# Patient Record
Sex: Male | Born: 1970 | Race: White | Hispanic: No | Marital: Single | Smoking: Current some day smoker
Health system: Southern US, Community
[De-identification: ages and names within clinical notes are randomized; demographics above are authoritative.]

## PROBLEM LIST (undated history)

## (undated) DIAGNOSIS — K633 Ulcer of intestine: Secondary | ICD-10-CM

## (undated) DIAGNOSIS — E119 Type 2 diabetes mellitus without complications: Secondary | ICD-10-CM

## (undated) DIAGNOSIS — N200 Calculus of kidney: Secondary | ICD-10-CM

## (undated) DIAGNOSIS — E785 Hyperlipidemia, unspecified: Secondary | ICD-10-CM

## (undated) DIAGNOSIS — F32A Depression, unspecified: Secondary | ICD-10-CM

## (undated) DIAGNOSIS — F419 Anxiety disorder, unspecified: Secondary | ICD-10-CM

## (undated) DIAGNOSIS — I10 Essential (primary) hypertension: Secondary | ICD-10-CM

## (undated) DIAGNOSIS — B019 Varicella without complication: Secondary | ICD-10-CM

## (undated) DIAGNOSIS — M549 Dorsalgia, unspecified: Secondary | ICD-10-CM

## (undated) DIAGNOSIS — F329 Major depressive disorder, single episode, unspecified: Secondary | ICD-10-CM

## (undated) HISTORY — DX: Ulcer of intestine: K63.3

## (undated) HISTORY — PX: HERNIA REPAIR: SHX51

## (undated) HISTORY — DX: Anxiety disorder, unspecified: F41.9

## (undated) HISTORY — DX: Type 2 diabetes mellitus without complications: E11.9

## (undated) HISTORY — DX: Varicella without complication: B01.9

## (undated) HISTORY — DX: Essential (primary) hypertension: I10

## (undated) HISTORY — PX: FRACTURE SURGERY: SHX138

## (undated) HISTORY — PX: BACK SURGERY: SHX140

## (undated) HISTORY — DX: Hyperlipidemia, unspecified: E78.5

## (undated) HISTORY — PX: SPINE SURGERY: SHX786

## (undated) HISTORY — DX: Calculus of kidney: N20.0

---

## 2008-03-26 HISTORY — PX: ANKLE FRACTURE SURGERY: SHX122

## 2008-03-26 HISTORY — PX: ELBOW FRACTURE SURGERY: SHX616

## 2015-03-08 ENCOUNTER — Emergency Department (HOSPITAL_COMMUNITY): Payer: BC Managed Care – PPO

## 2015-03-08 ENCOUNTER — Inpatient Hospital Stay (HOSPITAL_COMMUNITY): Payer: BC Managed Care – PPO

## 2015-03-08 ENCOUNTER — Encounter (HOSPITAL_COMMUNITY): Payer: Self-pay | Admitting: Emergency Medicine

## 2015-03-08 ENCOUNTER — Inpatient Hospital Stay (HOSPITAL_COMMUNITY)
Admission: EM | Admit: 2015-03-08 | Discharge: 2015-03-10 | DRG: 918 | Disposition: A | Discharge status: Home / Self Care | Payer: BC Managed Care – PPO | Attending: Internal Medicine | Admitting: Internal Medicine

## 2015-03-08 DIAGNOSIS — M549 Dorsalgia, unspecified: Secondary | ICD-10-CM | POA: Diagnosis present

## 2015-03-08 DIAGNOSIS — R4182 Altered mental status, unspecified: Secondary | ICD-10-CM | POA: Diagnosis present

## 2015-03-08 DIAGNOSIS — I10 Essential (primary) hypertension: Secondary | ICD-10-CM | POA: Diagnosis present

## 2015-03-08 DIAGNOSIS — E872 Acidosis, unspecified: Secondary | ICD-10-CM

## 2015-03-08 DIAGNOSIS — G4730 Sleep apnea, unspecified: Secondary | ICD-10-CM | POA: Diagnosis present

## 2015-03-08 DIAGNOSIS — R410 Disorientation, unspecified: Secondary | ICD-10-CM

## 2015-03-08 DIAGNOSIS — T50904A Poisoning by unspecified drugs, medicaments and biological substances, undetermined, initial encounter: Secondary | ICD-10-CM | POA: Diagnosis not present

## 2015-03-08 DIAGNOSIS — T50901A Poisoning by unspecified drugs, medicaments and biological substances, accidental (unintentional), initial encounter: Secondary | ICD-10-CM | POA: Diagnosis present

## 2015-03-08 DIAGNOSIS — T428X1A Poisoning by antiparkinsonism drugs and other central muscle-tone depressants, accidental (unintentional), initial encounter: Principal | ICD-10-CM | POA: Diagnosis present

## 2015-03-08 DIAGNOSIS — G8921 Chronic pain due to trauma: Secondary | ICD-10-CM | POA: Diagnosis present

## 2015-03-08 DIAGNOSIS — Z833 Family history of diabetes mellitus: Secondary | ICD-10-CM

## 2015-03-08 DIAGNOSIS — R7989 Other specified abnormal findings of blood chemistry: Secondary | ICD-10-CM

## 2015-03-08 DIAGNOSIS — Z8249 Family history of ischemic heart disease and other diseases of the circulatory system: Secondary | ICD-10-CM | POA: Diagnosis not present

## 2015-03-08 DIAGNOSIS — E119 Type 2 diabetes mellitus without complications: Secondary | ICD-10-CM | POA: Diagnosis present

## 2015-03-08 DIAGNOSIS — T50904S Poisoning by unspecified drugs, medicaments and biological substances, undetermined, sequela: Secondary | ICD-10-CM | POA: Diagnosis not present

## 2015-03-08 DIAGNOSIS — E871 Hypo-osmolality and hyponatremia: Secondary | ICD-10-CM | POA: Diagnosis present

## 2015-03-08 DIAGNOSIS — F329 Major depressive disorder, single episode, unspecified: Secondary | ICD-10-CM | POA: Diagnosis present

## 2015-03-08 HISTORY — DX: Major depressive disorder, single episode, unspecified: F32.9

## 2015-03-08 HISTORY — DX: Dorsalgia, unspecified: M54.9

## 2015-03-08 HISTORY — DX: Depression, unspecified: F32.A

## 2015-03-08 LAB — CK: Total CK: 180 U/L (ref 49–397)

## 2015-03-08 LAB — URINALYSIS, ROUTINE W REFLEX MICROSCOPIC
Bilirubin Urine: NEGATIVE
Glucose, UA: >1000 mg/dL — AB
Hgb urine dipstick: NEGATIVE
Ketones, ur: NEGATIVE mg/dL
Leukocytes, UA: NEGATIVE
Nitrite: NEGATIVE
Protein, ur: NEGATIVE mg/dL
Specific Gravity, Urine: 1.018 (ref 1.005–1.030)
Urobilinogen, UA: 0.2 mg/dL (ref 0.0–1.0)
pH: 6 (ref 5.0–8.0)

## 2015-03-08 LAB — CBC WITH DIFFERENTIAL/PLATELET
Basophils Absolute: 0 10*3/uL (ref 0.0–0.1)
Basophils Relative: 0 % (ref 0–1)
Eosinophils Absolute: 0 10*3/uL (ref 0.0–0.7)
Eosinophils Relative: 0 % (ref 0–5)
HCT: 38.6 % — ABNORMAL LOW (ref 39.0–52.0)
Hemoglobin: 13 g/dL (ref 13.0–17.0)
Lymphocytes Relative: 24 % (ref 12–46)
Lymphs Abs: 2.8 10*3/uL (ref 0.7–4.0)
MCH: 28.8 pg (ref 26.0–34.0)
MCHC: 33.7 g/dL (ref 30.0–36.0)
MCV: 85.6 fL (ref 78.0–100.0)
Monocytes Absolute: 0.9 10*3/uL (ref 0.1–1.0)
Monocytes Relative: 8 % (ref 3–12)
Neutro Abs: 7.8 10*3/uL — ABNORMAL HIGH (ref 1.7–7.7)
Neutrophils Relative %: 68 % (ref 43–77)
Platelets: 187 10*3/uL (ref 150–400)
RBC: 4.51 MIL/uL (ref 4.22–5.81)
RDW: 12.7 % (ref 11.5–15.5)
WBC: 11.4 10*3/uL — ABNORMAL HIGH (ref 4.0–10.5)

## 2015-03-08 LAB — COMPREHENSIVE METABOLIC PANEL
ALT: 32 U/L (ref 17–63)
AST: 33 U/L (ref 15–41)
Albumin: 4.5 g/dL (ref 3.5–5.0)
Alkaline Phosphatase: 60 U/L (ref 38–126)
Anion gap: 11 (ref 5–15)
BUN: 9 mg/dL (ref 6–20)
CO2: 25 mmol/L (ref 22–32)
Calcium: 8.8 mg/dL — ABNORMAL LOW (ref 8.9–10.3)
Chloride: 97 mmol/L — ABNORMAL LOW (ref 101–111)
Creatinine, Ser: 0.82 mg/dL (ref 0.61–1.24)
GFR calc Af Amer: >60 mL/min (ref >60)
GFR calc non Af Amer: >60 mL/min (ref >60)
Glucose, Bld: 274 mg/dL — ABNORMAL HIGH (ref 65–99)
Potassium: 3.7 mmol/L (ref 3.5–5.1)
Sodium: 133 mmol/L — ABNORMAL LOW (ref 135–145)
Total Bilirubin: 0.7 mg/dL (ref 0.3–1.2)
Total Protein: 7.7 g/dL (ref 6.5–8.1)

## 2015-03-08 LAB — RAPID URINE DRUG SCREEN, HOSP PERFORMED
Amphetamines: NOT DETECTED
Barbiturates: NOT DETECTED
Benzodiazepines: NOT DETECTED
Cocaine: NOT DETECTED
Opiates: NOT DETECTED
Tetrahydrocannabinol: NOT DETECTED

## 2015-03-08 LAB — CBG MONITORING, ED: Glucose-Capillary: 181 mg/dL — ABNORMAL HIGH (ref 65–99)

## 2015-03-08 LAB — MAGNESIUM: Magnesium: 1.5 mg/dL — ABNORMAL LOW (ref 1.7–2.4)

## 2015-03-08 LAB — ACETAMINOPHEN LEVEL: Acetaminophen (Tylenol), Serum: <10 ug/mL — ABNORMAL LOW (ref 10–30)

## 2015-03-08 LAB — LACTIC ACID, PLASMA
Lactic Acid, Venous: 3.4 mmol/L (ref 0.5–2.0)
Lactic Acid, Venous: 3.2 mmol/L (ref 0.5–2.0)

## 2015-03-08 LAB — URINE MICROSCOPIC-ADD ON: Urine-Other: NONE SEEN

## 2015-03-08 LAB — SALICYLATE LEVEL: Salicylate Lvl: <4 mg/dL (ref 2.8–30.0)

## 2015-03-08 LAB — MRSA PCR SCREENING: MRSA BY PCR: NEGATIVE

## 2015-03-08 MED ORDER — SODIUM CHLORIDE 0.9 % IV BOLUS (SEPSIS)
1000.0000 mL | Freq: Once | INTRAVENOUS | Status: AC
  Administered 2015-03-08: 1000 mL via INTRAVENOUS

## 2015-03-08 MED ORDER — SODIUM CHLORIDE 0.9 % IV BOLUS (SEPSIS)
1000.0000 mL | Freq: Once | INTRAVENOUS | Status: AC
Start: 2015-03-08 — End: 2015-03-08
  Administered 2015-03-08: 1000 mL via INTRAVENOUS

## 2015-03-08 MED ORDER — ACETAMINOPHEN 325 MG PO TABS: 650.0000 mg | ORAL_TABLET | Freq: Four times a day (QID) | ORAL | Status: DC | PRN

## 2015-03-08 MED ORDER — INSULIN ASPART 100 UNIT/ML ~~LOC~~ SOLN
0.0000 [IU] | Freq: Three times a day (TID) | SUBCUTANEOUS | Status: DC
  Administered 2015-03-08: 3 [IU] via SUBCUTANEOUS
  Administered 2015-03-09 (×2): 5 [IU] via SUBCUTANEOUS
  Administered 2015-03-09 – 2015-03-10 (×2): 3 [IU] via SUBCUTANEOUS
  Administered 2015-03-10: 2 [IU] via SUBCUTANEOUS

## 2015-03-08 MED ORDER — ONDANSETRON HCL 4 MG PO TABS: 4.0000 mg | ORAL_TABLET | Freq: Four times a day (QID) | ORAL | Status: DC | PRN

## 2015-03-08 MED ORDER — SODIUM CHLORIDE 0.9 % IV SOLN
INTRAVENOUS | Status: DC
  Administered 2015-03-08 (×2): via INTRAVENOUS

## 2015-03-08 MED ORDER — SODIUM CHLORIDE 0.9 % IJ SOLN: 3.0000 mL | Freq: Two times a day (BID) | INTRAMUSCULAR | Status: DC

## 2015-03-08 MED ORDER — MAGNESIUM SULFATE 2 GM/50ML IV SOLN
2.0000 g | Freq: Once | INTRAVENOUS | Status: AC
  Administered 2015-03-08: 2 g via INTRAVENOUS
  Filled 2015-03-08: qty 50

## 2015-03-08 MED ORDER — PIPERACILLIN-TAZOBACTAM 3.375 G IVPB
3.3750 g | Freq: Three times a day (TID) | INTRAVENOUS | Status: DC
  Administered 2015-03-09: 3.375 g via INTRAVENOUS
  Filled 2015-03-08: qty 50

## 2015-03-08 MED ORDER — ENOXAPARIN SODIUM 40 MG/0.4ML ~~LOC~~ SOLN
40.0000 mg | SUBCUTANEOUS | Status: DC
  Administered 2015-03-08 – 2015-03-09 (×2): 40 mg via SUBCUTANEOUS
  Filled 2015-03-08 (×2): qty 0.4

## 2015-03-08 MED ORDER — ONDANSETRON HCL 4 MG/2ML IJ SOLN
4.0000 mg | Freq: Four times a day (QID) | INTRAMUSCULAR | Status: DC | PRN
  Administered 2015-03-09 – 2015-03-10 (×3): 4 mg via INTRAVENOUS
  Filled 2015-03-08 (×3): qty 2

## 2015-03-08 MED ORDER — ACETAMINOPHEN 650 MG RE SUPP: 650.0000 mg | Freq: Four times a day (QID) | RECTAL | Status: DC | PRN

## 2015-03-08 MED ORDER — PIPERACILLIN-TAZOBACTAM 3.375 G IVPB 30 MIN
3.3750 g | INTRAVENOUS | Status: AC
  Administered 2015-03-08: 3.375 g via INTRAVENOUS
  Filled 2015-03-08: qty 50

## 2015-03-08 NOTE — ED Notes (Signed)
Poison control updated-they are not going to continue to follow him due to him being awake and alert

## 2015-03-08 NOTE — ED Notes (Signed)
I attempted to collect labs and was unsuccessful.  I made nurse aware. 

## 2015-03-08 NOTE — ED Notes (Signed)
Patient adamantly denies being suicidal-states he took too much medication, not in an attempt to harm himself-sitter at bedside

## 2015-03-08 NOTE — ED Notes (Signed)
Lactic acid 3.4. Lauren, RN notified.

## 2015-03-08 NOTE — ED Notes (Signed)
Unable to collect labs at this time patient is on the way to xray 

## 2015-03-08 NOTE — ED Notes (Signed)
MD paged and notified of critical lab

## 2015-03-08 NOTE — H&P (Addendum)
[facility] History and Physical  Jason [name] ZOX:[id number] DOB: 02/03/1971 DOA: 03/08/2015  Referring physician: Dr Juleen China PCP: No primary care provider on file.   Chief Complaint: overdose, AMS>   HPI: Jason [name] is a 44 y.o. male with PMH significant for diabetes, chronic back pain, on multiple medications, who was found at his work with AMS. Patient took several of his medications, unknown amount. He doesn't know why he took it. Amount of pill base on pills count per EMS report as follow : Up to 17,600 mg gabapentin, 14,000 mg soma, 220 mg ambien and  of clonazepam. Unclear if this is accurate.  Patient denies suicidal thought. No chest pain, no cough, no abdominal pain. He is complaining of back pain. He is now alert.   He says that he gets his medications from [place]. He recently move here on August. Does not have a PCP here.   Review of Systems: negative , except as per HPI/    Past Medical History  Diagnosis Date  . Back pain   . Depression      Past Surgical History  Procedure Laterality Date  . Back surgery         Abdominal surgeries.   Social History:  reports that he has never smoked. He has never used smokeless tobacco. He reports that he drinks alcohol. His drug history is not on file.  Allergies  Allergen Reactions  . Ibuprofen Hives   Family History: Mother: history of pancreatitis, Diabetic. Father: died of CHF.   Prior to Admission medications   Medication Sig Start Date End Date Taking? Authorizing Provider  metFORMIN (GLUCOPHAGE) 1000 MG tablet Take 1,000 mg by mouth 2 (two) times daily with a meal.   Yes Historical Provider, MD   Physical Exam: Filed Vitals:   03/08/15 1209 03/08/15 1230 03/08/15 1300 03/08/15 1334  BP: 143/96 137/89 131/89 132/87  Pulse: 111 111 101 102  Temp:      TempSrc:      Resp: SpO2: 97% 100% 96% 97%    Wt Readings from Last 3 Encounters:  No data found for Wt    General:  Appears calm  and comfortable, alert, following command.  Eyes: PERRL, normal lids, irises & conjunctiva ENT: grossly normal hearing, lips & tongue Neck: no LAD, masses or thyromegaly Cardiovascular: RRR, no m/r/g. No LE edema. Respiratory: CTA bilaterally, no w/r/r. Normal respiratory effort. Abdomen: soft, ntnd Skin: no rash or induration seen on limited exam Musculoskeletal: grossly normal tone BUE/BLE Psychiatric: grossly normal mood and affect, speech fluent and appropriate Neurologic: grossly non-focal. Confuse.           Labs on Admission:  Basic Metabolic Panel:  Recent Labs Lab 03/08/15 1055  NA 133*  K 3.7  CL 97*  CO2 25  GLUCOSE 274*  BUN 9  CREATININE 0.82  CALCIUM 8.8*  MG 1.5*   Liver Function Tests:  Recent Labs Lab 03/08/15 1055  AST 33  ALT 32  ALKPHOS 60  BILITOT 0.7  PROT 7.7  ALBUMIN 4.5   No results for input(s): LIPASE, AMYLASE in the last 168 hours. No results for input(s): AMMONIA in the last 168 hours. CBC:  Recent Labs Lab 03/08/15 1246  WBC 11.4*  NEUTROABS 7.8*  HGB 13.0  HCT 38.6*  MCV 85.6  PLT 187   Cardiac Enzymes:  Recent Labs Lab 03/08/15 1055  CKTOTAL 180    BNP (last 3 results) No results for input(s):  BNP in the last 8760 hours.  ProBNP (last 3 results) No results for input(s): PROBNP in the last 8760 hours.  CBG: No results for input(s): GLUCAP in the last 168 hours.  Radiological Exams on Admission: Ct Head Wo Contrast  03/08/2015   CLINICAL DATA:  Fall and confusion.  EXAM: CT HEAD WITHOUT CONTRAST  CT CERVICAL SPINE WITHOUT CONTRAST  TECHNIQUE: Multidetector CT imaging of the head and cervical spine was performed following the standard protocol without intravenous contrast. Multiplanar CT image reconstructions of the cervical spine were also generated.  COMPARISON:  None.  FINDINGS: CT HEAD FINDINGS  The brain demonstrates no evidence of hemorrhage, infarction, edema, mass effect, extra-axial fluid collection,  hydrocephalus or mass lesion. The skull is unremarkable.  CT CERVICAL SPINE FINDINGS  The cervical spine shows normal alignment. There is no evidence of acute fracture or subluxation. No soft tissue swelling or hematoma is identified. There are no significant degenerative changes. No bony or soft tissue lesions are seen. The visualized airway is normally patent.  IMPRESSION: Normal CT studies of the head and cervical spine.   Electronically Signed   By: Irish Lack M.D.   On: 03/08/2015 12:05   Ct Cervical Spine Wo Contrast  03/08/2015   CLINICAL DATA:  Fall and confusion.  EXAM: CT HEAD WITHOUT CONTRAST  CT CERVICAL SPINE WITHOUT CONTRAST  TECHNIQUE: Multidetector CT imaging of the head and cervical spine was performed following the standard protocol without intravenous contrast. Multiplanar CT image reconstructions of the cervical spine were also generated.  COMPARISON:  None.  FINDINGS: CT HEAD FINDINGS  The brain demonstrates no evidence of hemorrhage, infarction, edema, mass effect, extra-axial fluid collection, hydrocephalus or mass lesion. The skull is unremarkable.  CT CERVICAL SPINE FINDINGS  The cervical spine shows normal alignment. There is no evidence of acute fracture or subluxation. No soft tissue swelling or hematoma is identified. There are no significant degenerative changes. No bony or soft tissue lesions are seen. The visualized airway is normally patent.  IMPRESSION: Normal CT studies of the head and cervical spine.   Electronically Signed   By: Irish Lack M.D.   On: 03/08/2015 12:05    EKG: Independently reviewed. No available. Will order.   Assessment/Plan Principal Problem:   Overdose Active Problems:   Lactic acidosis   Hyponatremia   Hypomagnesemia   1-Overdose:  -Admit to step down unit for close observation of Mental status, respiratory status. Unclear amount of pills that patient took. Per EMS report significant amount.  -IV fluids, telemetry.  -Sitter at  bedside, awaiting Psych evaluation.  -Psych consulted.  -hold medications: soma, gabapentin....  2-Hypomagnesemia; IV mag ordered.   3-Lactic acidosis; BP stable. Could be from overdose. IV fluids. Follow trend. Will check blood culture, chest x ray. Urine culture.  BP stable. No evidence of DKA> no abdominal pain. Will start empirically zosyn to cover for aspiration.   4-Diabetes; hold oral medications. SSI.   5-Hyponatremia; IV fluids.     Code Status: full code.  DVT Prophylaxis:lovenox.  Family Communication: none at bedside.  Disposition Plan: expect 2 to 3 days inpatient.   Time spent: 75 minutes.   Hartley Barefoot A [facility] Pager [phone]

## 2015-03-08 NOTE — ED Notes (Signed)
Pt has given permission for RN to give medical information to mother, Antionette, and sister, Galen Daft over the phone.

## 2015-03-08 NOTE — ED Notes (Signed)
Pt denies SI at this time. Pt sts "I just took too much medication, it wasn't on purpose. I don't want to hurt myself.

## 2015-03-08 NOTE — ED Notes (Signed)
PC control called, concerned about possible SOMA Coma. Watch for drowsiness, hypotension, tachycardia. Intubate if necessary. Lab work consisting of bmp, acetaminophen level. EKG.

## 2015-03-08 NOTE — ED Notes (Signed)
Patient had a pair of shoes, one t-shirt, and one pair of shorts placed in locker #31.

## 2015-03-08 NOTE — Progress Notes (Signed)
ANTIBIOTIC CONSULT NOTE - INITIAL  Pharmacy Consult for Zosyn Indication: Aspiration pneumonia  Allergies  Allergen Reactions  . Ibuprofen Hives    Patient Measurements:    Vital Signs: Temp: 98.8 F (37.1 C) (09/12 1558) Temp Source: Oral (09/12 1558) BP: 132/83 mmHg (09/12 1558) Pulse Rate: 98 (09/12 1558) Intake/Output from previous day:   Intake/Output from this shift:    Labs:  Recent Labs  03/08/15 1055 03/08/15 1246  WBC  --  11.4*  HGB  --  13.0  PLT  --  187  CREATININE 0.82  --    CrCl cannot be calculated (Unknown ideal weight.). No results for input(s): VANCOTROUGH, VANCOPEAK, VANCORANDOM, GENTTROUGH, GENTPEAK, GENTRANDOM, TOBRATROUGH, TOBRAPEAK, TOBRARND, AMIKACINPEAK, AMIKACINTROU, AMIKACIN in the last 72 hours.   Microbiology: No results found for this or any previous visit (from the past 720 hour(s)).  Medical History: Past Medical History  Diagnosis Date  . Back pain   . Depression     Medications:  Scheduled:  . insulin aspart  0-9 Units Subcutaneous TID WC   Infusions:  . sodium chloride Stopped (03/08/15 1517)  . piperacillin-tazobactam     PRN:   Assessment: 44 yo male admitted s/p overdose of multiple medications.  Pharmacy consulted to dose Zosyn for possible aspiration pneumonia.  Goal of Therapy:  Eradication of infection Dose appropriate for renal function  Plan:  Zosyn 3.375gm IV q8h (4hr extended infusions) Follow up renal function & cultures, clinical course  Loralee Pacas, PharmD, BCPS Pager: [phone] 03/08/2015,4:15 PM

## 2015-03-08 NOTE — ED Notes (Signed)
Per EMS, Pt was found altered by staff of [employer]. Pt Pt admits to SI. Per EMS, pt took 17,600 mg of gabapentin, 14,000 mg of SOMA, 220 mg of Zolpidem, and  of clonazepam. These were the bottles found at the scene and mg based on missing pills. Medications were just prescribed over the past week. Pt altered, sts it is 1995, unable to tell what month. Pt sts "I took the pills a few hours ago." Last known well yesterday, seen by other staff members. No neuro deficits besides memory issues. Pt has abrasion to L elbow and L knee but denies pain. Pt in C Collar but had been sitting and moving around when EMS arrived. Pt may have taken more, but bottles not found at scene. Pt is prescribed other medications.

## 2015-03-08 NOTE — ED Provider Notes (Signed)
CSN: [id number]     Arrival date & time 03/08/15  1018 History   First MD Initiated Contact with Patient 03/08/15 1031     Chief Complaint  Patient presents with  . Drug Overdose     (Consider location/radiation/quality/duration/timing/severity/associated sxs/prior Treatment) HPI   [name] with what sounds like intentional drug overdose of his prescribed meds. Up to 17,600 mg gabapentin, 14,000 mg soma, 220 mg ambien and  of clonazepam based on pill counts by EMS. Pt found altered by staff members at work. Apparently told EMS he took them a couple hours prior. Pt is currently very altered and not able to provide much useful additional history. Speech very slow and hesitant. Able to tell me in hospital. Unable to even come up with answer as to possible date. Reports history of chronic lower back pain. Denies acute pain but he is not reliable historian. Some abrasions to extremities. Place in c-collar by EMS. Reportedly sitting when EMS arrived.   No past medical history on file. No past surgical history on file. No family history on file. Social History  Substance Use Topics  . Smoking status: Not on file  . Smokeless tobacco: Not on file  . Alcohol Use: Not on file    Review of Systems  Level 5 caveat because of altered mental status.   Allergies  Review of patient's allergies indicates not on file.  Home Medications   Prior to Admission medications   Not on File   BP 127/81 mmHg  Pulse 110  Temp(Src) 98 F (36.7 C) (Oral)  Resp 29  SpO2 95% Physical Exam  Constitutional: He appears well-developed and well-nourished. No distress.  HENT:  Head: Normocephalic and atraumatic.  Eyes: Conjunctivae are normal. Right eye exhibits no discharge. Left eye exhibits no discharge.  Neck: Neck supple.  Cardiovascular: Regular rhythm and normal heart sounds.  Exam reveals no gallop and no friction rub.   No murmur heard. Mild tachycardia. Regular.   Pulmonary/Chest: Effort normal  and breath sounds normal. No respiratory distress.  Abdominal: Soft. He exhibits no distension. There is no tenderness.  Musculoskeletal: He exhibits no edema or tenderness.  Small abrasions to L elbow and knee. No apparent bony tenderness or pain with ROM.   Neurological:  Laying in bed with eyes open. Pupils ~52mm, symmetric and reactive. Speech somewhat slurred but understandable. Prolonged delay in answering questions. Seems to lose train of thought very easily. Follows commands. CN 2-12 intact. Horizontal nystagmus. Strength is 5/5 b/l u/l extremities but has increased muscle tone and some cog wheeling noted. no inducible clonus. Patellar and biceps reflexes seem normal.    Skin: Skin is warm and dry. He is not diaphoretic.  Psychiatric: He has a normal mood and affect. His behavior is normal. Thought content normal.  Nursing note and vitals reviewed.   ED Course  Procedures (including critical care time)  CRITICAL CARE Performed by: Raeford Razor Total critical care time: 35 minutes Critical care time was exclusive of separately billable procedures and treating other patients. Critical care was necessary to treat or prevent imminent or life-threatening deterioration. Critical care was time spent personally by me on the following activities: development of treatment plan with patient and/or surrogate as well as nursing, discussions with consultants, evaluation of patient's response to treatment, examination of patient, obtaining history from patient or surrogate, ordering and performing treatments and interventions, ordering and review of laboratory studies, ordering and review of radiographic studies, pulse oximetry and re-evaluation of patient's condition. Labs  Review Labs Reviewed  COMPREHENSIVE METABOLIC PANEL - Abnormal; Notable for the following:    Sodium 133 (*)    Chloride 97 (*)    Glucose, Bld 274 (*)    Calcium 8.8 (*)    All other components within normal limits   ACETAMINOPHEN LEVEL - Abnormal; Notable for the following:    Acetaminophen (Tylenol), Serum <10 (*)    All other components within normal limits  MAGNESIUM - Abnormal; Notable for the following:    Magnesium 1.5 (*)    All other components within normal limits  LACTIC ACID, PLASMA - Abnormal; Notable for the following:    Lactic Acid, Venous 3.4 (*)    All other components within normal limits  SALICYLATE LEVEL  CK  URINE RAPID DRUG SCREEN, HOSP PERFORMED  URINALYSIS, ROUTINE W REFLEX MICROSCOPIC (NOT AT [facility])  LACTIC ACID, PLASMA    Imaging Review No results found. I have personally reviewed and evaluated these images and lab results as part of my medical decision-making.   EKG Interpretation   Date/Time:  Monday March 08 2015 16:04:17 EDT Ventricular Rate:  96 PR Interval:  132 QRS Duration: 74 QT Interval:  370 QTC Calculation: 467 R Axis:   26 Text Interpretation:  Normal sinus rhythm Normal ECG ED PHYSICIAN  INTERPRETATION AVAILABLE IN [facility] Confirmed by TEST, Record  ([id number]) on 03/09/2015 7:54:17 AM      MDM   Final diagnoses:  Overdose, undetermined intent, initial encounter    44 year old male with what sounds like intentional overdose of undetermined intent. Tachycardic on arrival which has improved. Remains significantly altered at this time. Will admit for further observation.    Raeford Razor, MD 03/19/15 [phone]

## 2015-03-08 NOTE — ED Notes (Signed)
Patient and belongings wanded by security.  

## 2015-03-08 NOTE — ED Notes (Signed)
Bed: WA19 Expected date:  Expected time:  Means of arrival:  Comments: 

## 2015-03-09 DIAGNOSIS — T50904S Poisoning by unspecified drugs, medicaments and biological substances, undetermined, sequela: Secondary | ICD-10-CM

## 2015-03-09 LAB — CBC
HCT: 37.7 % — ABNORMAL LOW (ref 39.0–52.0)
Hemoglobin: 12.8 g/dL — ABNORMAL LOW (ref 13.0–17.0)
MCH: 29.2 pg (ref 26.0–34.0)
MCHC: 34 g/dL (ref 30.0–36.0)
MCV: 86.1 fL (ref 78.0–100.0)
PLATELETS: 187 10*3/uL (ref 150–400)
RBC: 4.38 MIL/uL (ref 4.22–5.81)
RDW: 13 % (ref 11.5–15.5)
WBC: 6.4 10*3/uL (ref 4.0–10.5)

## 2015-03-09 LAB — COMPREHENSIVE METABOLIC PANEL
ALBUMIN: 3.7 g/dL (ref 3.5–5.0)
ALK PHOS: 57 U/L (ref 38–126)
ALT: 26 U/L (ref 17–63)
AST: 22 U/L (ref 15–41)
Anion gap: 8 (ref 5–15)
BILIRUBIN TOTAL: 0.7 mg/dL (ref 0.3–1.2)
BUN: 8 mg/dL (ref 6–20)
CALCIUM: 8.4 mg/dL — AB (ref 8.9–10.3)
CO2: 26 mmol/L (ref 22–32)
CREATININE: 0.79 mg/dL (ref 0.61–1.24)
Chloride: 100 mmol/L — ABNORMAL LOW (ref 101–111)
GFR calc Af Amer: >60 mL/min (ref >60)
GLUCOSE: 284 mg/dL — AB (ref 65–99)
Potassium: 3.6 mmol/L (ref 3.5–5.1)
Sodium: 134 mmol/L — ABNORMAL LOW (ref 135–145)
TOTAL PROTEIN: 6.9 g/dL (ref 6.5–8.1)

## 2015-03-09 LAB — GLUCOSE, CAPILLARY
GLUCOSE-CAPILLARY: 201 mg/dL — AB (ref 65–99)
GLUCOSE-CAPILLARY: 211 mg/dL — AB (ref 65–99)
GLUCOSE-CAPILLARY: 275 mg/dL — AB (ref 65–99)
GLUCOSE-CAPILLARY: 160 mg/dL — AB (ref 65–99)
Glucose-Capillary: 258 mg/dL — ABNORMAL HIGH (ref 65–99)
Glucose-Capillary: 235 mg/dL — ABNORMAL HIGH (ref 65–99)

## 2015-03-09 LAB — URINE CULTURE: CULTURE: NO GROWTH

## 2015-03-09 LAB — LACTIC ACID, PLASMA
LACTIC ACID, VENOUS: 1.6 mmol/L (ref 0.5–2.0)
LACTIC ACID, VENOUS: 1.6 mmol/L (ref 0.5–2.0)

## 2015-03-09 LAB — VITAMIN B12: Vitamin B-12: 175 pg/mL — ABNORMAL LOW (ref 180–914)

## 2015-03-09 MED ORDER — SODIUM CHLORIDE 0.9 % IV SOLN
INTRAVENOUS | Status: DC
  Administered 2015-03-09 – 2015-03-10 (×2): via INTRAVENOUS

## 2015-03-09 MED ORDER — GLYBURIDE 5 MG PO TABS
5.0000 mg | ORAL_TABLET | Freq: Two times a day (BID) | ORAL | Status: DC
  Administered 2015-03-09 – 2015-03-10 (×3): 5 mg via ORAL
  Filled 2015-03-09 (×3): qty 1

## 2015-03-09 MED ORDER — HYDRALAZINE HCL 20 MG/ML IJ SOLN
10.0000 mg | Freq: Four times a day (QID) | INTRAMUSCULAR | Status: DC | PRN
  Administered 2015-03-09: 10 mg via INTRAVENOUS
  Filled 2015-03-09: qty 1

## 2015-03-09 MED ORDER — GABAPENTIN 100 MG PO CAPS
200.0000 mg | ORAL_CAPSULE | Freq: Two times a day (BID) | ORAL | Status: DC
  Administered 2015-03-09 – 2015-03-10 (×3): 200 mg via ORAL
  Filled 2015-03-09 (×3): qty 2
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
## 2015-03-09 NOTE — Progress Notes (Signed)
[facility] PROGRESS NOTE  Jason [name] ZOX:[id number] DOB: 11-13-1970 DOA: 03/08/2015 PCP: No primary care provider on file.  Assessment/Plan: 1-Overdose; unclear if intentional:  -Psych consulted.  -Patient is alert, oriented.  -he relates he doesn't take every day klonopin.  -will resume lower dose gabapentin.  -hold soma.  -UDS negative,   2-HTN:  PRN hydralazine  3-Lactic acidosis;  Related to overdose, hypoperfusion. Resolved with fluids. Chest x ray negative, UA negative. Follow urine culture.  Will stop Zosyn, no evidence of infection. Follow up blood culture.   4-Diabetes; SSI.  Resume glyburide.    Code Status: Full Code Family Communication: care discussed with patient.  Disposition Plan: psych evaluation.    Consultants:  Psych  Procedures:  none  Antibiotics:  Zosyn 9-12----9-13  HPI/Subjective: He is alert, following command. Oriented to time , place and person.  He doesn't remember  How he took that many medications. He think he maybe fell night prior, and took something for pain   Objective: Filed Vitals:   03/09/15 0600  BP: 160/103  Pulse: 92  Temp:   Resp: 11    Intake/Output Summary (Last 24 hours) at 03/09/15 0735 Last data filed at 03/09/15 0700  Gross per 24 hour  Intake 1902.09 ml  Output      0 ml  Net 1902.09 ml   Filed Weights   03/08/15 1809  Weight: 93.7 kg (206 lb 9.1 oz)    Exam:   General:  Alert in no distress  Cardiovascular: S 1, S 2 RRR  Respiratory: CTA  Abdomen: BS present, soft, nt  Musculoskeletal: no edema  Data Reviewed: Basic Metabolic Panel:  Recent Labs Lab 03/08/15 1055 03/09/15 0340  NA 133* 134*  K 3.7 3.6  CL 97* 100*  CO2 25 26  GLUCOSE 274* 284*  BUN 9 8  CREATININE 0.82 0.79  CALCIUM 8.8* 8.4*  MG 1.5*  --    Liver Function Tests:  Recent Labs Lab 03/08/15 1055 03/09/15 0340  AST 33 22  ALT 32 26  ALKPHOS 60 57  BILITOT 0.7 0.7  PROT 7.7 6.9  ALBUMIN 4.5  3.7   No results for input(s): LIPASE, AMYLASE in the last 168 hours. No results for input(s): AMMONIA in the last 168 hours. CBC:  Recent Labs Lab 03/08/15 1246 03/09/15 0340  WBC 11.4* 6.4  NEUTROABS 7.8*  --   HGB 13.0 12.8*  HCT 38.6* 37.7*  MCV 85.6 86.1  PLT 187 187   Cardiac Enzymes:  Recent Labs Lab 03/08/15 1055  CKTOTAL 180   BNP (last 3 results) No results for input(s): BNP in the last 8760 hours.  ProBNP (last 3 results) No results for input(s): PROBNP in the last 8760 hours.  CBG:  Recent Labs Lab 03/08/15 1622  GLUCAP 181*    Recent Results (from the past 240 hour(s))  MRSA PCR Screening     Status: None   Collection Time: 03/08/15  6:19 PM  Result Value Ref Range Status   MRSA by PCR NEGATIVE NEGATIVE Final    Comment:        The GeneXpert MRSA Assay (FDA approved for NASAL specimens only), is one component of a comprehensive MRSA colonization surveillance program. It is not intended to diagnose MRSA infection nor to guide or monitor treatment for MRSA infections.      Studies: Dg Chest 2 View  03/08/2015   CLINICAL DATA:  Pt was found altered by staff of [employer]. Pt Pt admits to SI.  Per EMS, pt took 17,600 mg of gabapentin, 14,000 mg of SOMA, 220 mg of Zolpidem, and 60 mg of clonazepam.  EXAM: CHEST  2 VIEW  COMPARISON:  None.  FINDINGS: The heart size and mediastinal contours are within normal limits. Both lungs are clear. There is mild wedge compression deformity of L1, likely chronic.  IMPRESSION: No evidence for acute cardiopulmonary abnormality.   Electronically Signed   By: Norva Pavlov M.D.   On: 03/08/2015 16:00   Ct Head Wo Contrast  03/08/2015   CLINICAL DATA:  Fall and confusion.  EXAM: CT HEAD WITHOUT CONTRAST  CT CERVICAL SPINE WITHOUT CONTRAST  TECHNIQUE: Multidetector CT imaging of the head and cervical spine was performed following the standard protocol without intravenous contrast. Multiplanar CT image reconstructions of  the cervical spine were also generated.  COMPARISON:  None.  FINDINGS: CT HEAD FINDINGS  The brain demonstrates no evidence of hemorrhage, infarction, edema, mass effect, extra-axial fluid collection, hydrocephalus or mass lesion. The skull is unremarkable.  CT CERVICAL SPINE FINDINGS  The cervical spine shows normal alignment. There is no evidence of acute fracture or subluxation. No soft tissue swelling or hematoma is identified. There are no significant degenerative changes. No bony or soft tissue lesions are seen. The visualized airway is normally patent.  IMPRESSION: Normal CT studies of the head and cervical spine.   Electronically Signed   By: Irish Lack M.D.   On: 03/08/2015 12:05   Ct Cervical Spine Wo Contrast  03/08/2015   CLINICAL DATA:  Fall and confusion.  EXAM: CT HEAD WITHOUT CONTRAST  CT CERVICAL SPINE WITHOUT CONTRAST  TECHNIQUE: Multidetector CT imaging of the head and cervical spine was performed following the standard protocol without intravenous contrast. Multiplanar CT image reconstructions of the cervical spine were also generated.  COMPARISON:  None.  FINDINGS: CT HEAD FINDINGS  The brain demonstrates no evidence of hemorrhage, infarction, edema, mass effect, extra-axial fluid collection, hydrocephalus or mass lesion. The skull is unremarkable.  CT CERVICAL SPINE FINDINGS  The cervical spine shows normal alignment. There is no evidence of acute fracture or subluxation. No soft tissue swelling or hematoma is identified. There are no significant degenerative changes. No bony or soft tissue lesions are seen. The visualized airway is normally patent.  IMPRESSION: Normal CT studies of the head and cervical spine.   Electronically Signed   By: Irish Lack M.D.   On: 03/08/2015 12:05    Scheduled Meds: . enoxaparin (LOVENOX) injection  40 mg Subcutaneous Q24H  . insulin aspart  0-9 Units Subcutaneous TID WC  . piperacillin-tazobactam (ZOSYN)  IV  3.375 g Intravenous Q8H  . sodium  chloride  3 mL Intravenous Q12H   Continuous Infusions: . sodium chloride 125 mL/hr at 03/08/15 1700    Principal Problem:   Overdose Active Problems:   Lactic acidosis   Hyponatremia   Hypomagnesemia    Time spent: 35 minutes.     Hartley Barefoot A  [facility] Pager [phone]. If 7PM-7AM, please contact night-coverage at [email or web address], password [facility] 03/09/2015, 7:35 AM  LOS: 1 day
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
## 2015-03-09 NOTE — Progress Notes (Signed)
Inpatient Diabetes Program Recommendations  AACE/ADA: New Consensus Statement on Inpatient Glycemic Control (2015)  Target Ranges:  Prepandial:   less than 140 mg/dL      Peak postprandial:   less than 180 mg/dL (1-2 hours)      Critically ill patients:  140 - 180 mg/dL   Review of Glycemic Control  Diabetes history: DM2 Outpatient Diabetes medications: metformin 1000 mg bid, glyburide 5 mg bid Current orders for Inpatient glycemic control: Novolog sensitive tidwc  Results for Jason [name], Jason [name] (MRN [id number]) as of 03/09/2015 11:34  Ref. Range 03/08/2015 16:22 03/08/2015 22:53 03/09/2015 08:32  Glucose-Capillary Latest Ref Range: 65-99 mg/dL 409 (H) 811 (H) 914 (H)   Needs insulin adjustments.  Inpatient Diabetes Program Recommendations: Insulin - Basal: Consider addition of Lantus 15 units QHS Correction (SSI): Increase Novolog to moderate tidwc and hs HgbA1C: Need HgbA1C to assess glycemic control prior to hospitalization   Will continue to follow. Thank you. Ailene Ards, RD, LDN, CDE Inpatient Diabetes Coordinator [phone]

## 2015-03-09 NOTE — Care Management Note (Signed)
Case Management Note  Patient Details  Name: Jason [name] MRN: [id number] Date of Birth: 07/22/1970  Subjective/Objective:                 overdose   Action/Plan:Date:  Sept. 13, 2016 U.R. performed for needs and level of care. Will continue to follow for Case Management needs.  Marcelle Smiling, RN, BSN, [place]   [phone]   Expected Discharge Date:   (unknown)               Expected Discharge Plan:  Home/Self Care  In-House Referral:  NA  Discharge planning Services  CM Consult  Post Acute Care Choice:  NA Choice offered to:  NA  DME Arranged:    DME Agency:     HH Arranged:    HH Agency:     Status of Service:  In process, will continue to follow  Medicare Important Message Given:    Date Medicare IM Given:    Medicare IM give by:    Date Additional Medicare IM Given:    Additional Medicare Important Message give by:     If discussed at Long Length of Stay Meetings, dates discussed:    Additional Comments:  Golda Acre, RN 03/09/2015, 11:18 AM

## 2015-03-09 NOTE — Progress Notes (Signed)
   03/09/15 1000  Clinical Encounter Type  Visited With Patient  Visit Type Initial;Psychological support  Referral From Nurse  Consult/Referral To Chaplain  Spiritual Encounters  Spiritual Needs Other (Comment) (None specified)  Stress Factors  Patient Stress Factors None identified  Family Stress Factors None identified   Chaplain visited per consult. Patient appeared cheerful and talkative upon Chaplain's visit. There was a sitter present at the bedside.  The patient did not require spiritual care at this time.  Chaplain will follow up with the patient if requested.

## 2015-03-10 DIAGNOSIS — T50901A Poisoning by unspecified drugs, medicaments and biological substances, accidental (unintentional), initial encounter: Secondary | ICD-10-CM

## 2015-03-10 LAB — BASIC METABOLIC PANEL
ANION GAP: 9 (ref 5–15)
BUN: 8 mg/dL (ref 6–20)
CALCIUM: 8.6 mg/dL — AB (ref 8.9–10.3)
CO2: 23 mmol/L (ref 22–32)
Chloride: 105 mmol/L (ref 101–111)
Creatinine, Ser: 0.66 mg/dL (ref 0.61–1.24)
GLUCOSE: 199 mg/dL — AB (ref 65–99)
Potassium: 3.4 mmol/L — ABNORMAL LOW (ref 3.5–5.1)
SODIUM: 137 mmol/L (ref 135–145)

## 2015-03-10 LAB — HIV ANTIBODY (ROUTINE TESTING W REFLEX): HIV Screen 4th Generation wRfx: NONREACTIVE

## 2015-03-10 LAB — RPR: RPR: NONREACTIVE

## 2015-03-10 LAB — GLUCOSE, CAPILLARY
Glucose-Capillary: 200 mg/dL — ABNORMAL HIGH (ref 65–99)
Glucose-Capillary: 215 mg/dL — ABNORMAL HIGH (ref 65–99)

## 2015-03-10 MED ORDER — QUETIAPINE FUMARATE 50 MG PO TABS: 25.0000 mg | ORAL_TABLET | Freq: Every day | ORAL | Status: DC

## 2015-03-10 MED ORDER — ESCITALOPRAM OXALATE 20 MG PO TABS
20.0000 mg | ORAL_TABLET | Freq: Every day | ORAL | Status: DC
  Filled 2015-03-10: qty 1

## 2015-03-10 MED ORDER — ESCITALOPRAM OXALATE 20 MG PO TABS: 20.0000 mg | ORAL_TABLET | Freq: Every day | ORAL | Status: DC

## 2015-03-10 MED ORDER — GABAPENTIN 100 MG PO CAPS: 200.0000 mg | ORAL_CAPSULE | Freq: Two times a day (BID) | ORAL | Status: DC

## 2015-03-10 MED ORDER — POTASSIUM CHLORIDE CRYS ER 20 MEQ PO TBCR
40.0000 meq | EXTENDED_RELEASE_TABLET | Freq: Once | ORAL | Status: AC
  Administered 2015-03-10: 40 meq via ORAL
  Filled 2015-03-10: qty 2

## 2015-03-10 NOTE — Care Management Note (Signed)
Case Management Note  Patient Details  Name: Jason [name] MRN: [id number] Date of Birth: 27-Sep-1970  Subjective/Objective: Patient provided w/pcp listing-he will get pcp on his own, leaning toward [facility].Has [employer] health insurance.From home.                   Action/Plan:d/c plan home.No further d/c needs.   Expected Discharge Date:   (unknown)               Expected Discharge Plan:  Home/Self Care  In-House Referral:  NA, PCP / [facility]  Discharge planning Services  CM Consult  Post Acute Care Choice:  NA Choice offered to:  NA  DME Arranged:    DME Agency:     HH Arranged:    HH Agency:     Status of Service:  Completed, signed off  Medicare Important Message Given:    Date Medicare IM Given:    Medicare IM give by:    Date Additional Medicare IM Given:    Additional Medicare Important Message give by:     If discussed at Long Length of Stay Meetings, dates discussed:    Additional Comments:  Lanier Clam, RN 03/10/2015, 1:02 PM

## 2015-03-10 NOTE — Progress Notes (Signed)
Patient discharged home with belongings, resources for outpatient pysch assistance provided on discharge paperwork. IVs and tele removed, patient discharged home with belongings. Refused to be wheeled down in wheelchair, patient walked down with this RN to patient's ride at main entrance. Patient stable at time of discharge.

## 2015-03-10 NOTE — Discharge Summary (Signed)
Physician Discharge Summary  Jason [name] ZOX:[id number] DOB: 1971-05-08 DOA: 03/08/2015  PCP: No primary care provider on file.  Admit date: 03/08/2015 Discharge date: 03/10/2015  Time spent: 35 minutes  Recommendations for Outpatient Follow-up:  1. Needs PCP for chronic medical management,  2. Needs to follow up with psych out patient.   Discharge Diagnoses:    Overdose unintentional    Lactic acidosis   Hyponatremia   Hypomagnesemia   Discharge Condition: stable.   Diet recommendation: Carb modified.   Filed Weights   03/08/15 1809  Weight: 93.7 kg (206 lb 9.1 oz)    History of present illness:  Jason [name] is a 44 y.o. male with PMH significant for diabetes, chronic back pain, on multiple medications, who was found at his work with [employer]. Patient took several of his medications, unknown amount. He doesn't know why he took it. Amount of pill base on pills count per EMS report as follow : Up to 17,600 mg gabapentin, 14,000 mg soma, 220 mg ambien and  of clonazepam. Unclear if this is accurate.  Patient denies suicidal thought. No chest pain, no cough, no abdominal pain. He is complaining of back pain. He is now alert.   He says that he gets his medications from [place]. He recently move here on August. Does not have a PCP here.   Hospital Course:  1-Overdose; unintentional   -Psych consulted. he was clear by psych.  -Patient is alert, oriented. back to baseline.  -he relates he doesn't take every day klonopin. stopped it  couple of weeks ago. Discontinue at discharge. -will resume lower dose gabapentin.  -Discontinue  soma.  -UDS negative,  -resume lower dose seraquel and home dose lexapro.   2-HTN: PRN hydralazine. Resume home medications at discharge  3-Lactic acidosis;  Related to overdose, hypoperfusion. Resolved with fluids. Chest x ray negative, UA negative. Follow urine culture no growth to date.  Stopped  Zosyn, no evidence of infection. blood culture  no growth to date   4-Diabetes; SSI.  Resume glyburide and metformin at discharge  Procedures: none Consultations:  Psych  Discharge Exam: Filed Vitals:   03/10/15 0621  BP: 133/88  Pulse: 68  Temp: 98 F (36.7 C)  Resp: 16    General: NAD Cardiovascular: S 1, S 2 RRR Respiratory: CTA  Discharge Instructions   Discharge Instructions    Diet - low sodium heart healthy    Complete by:  As directed      Increase activity slowly    Complete by:  As directed           Current Discharge Medication List    START taking these medications   Details  escitalopram (LEXAPRO) 20 MG tablet Take 1 tablet (20 mg total) by mouth daily. Qty: 30 tablet, Refills: 0      CONTINUE these medications which have CHANGED   Details  gabapentin (NEURONTIN) 100 MG capsule Take 2 capsules (200 mg total) by mouth 2 (two) times daily. Qty: 30 capsule, Refills: 0    QUEtiapine (SEROQUEL) 50 MG tablet Take 0.5 tablets (25 mg total) by mouth at bedtime. Qty: 15 tablet, Refills: 0      CONTINUE these medications which have NOT CHANGED   Details  glyBURIDE (DIABETA) 5 MG tablet Take 5 mg by mouth 2 (two) times daily.    metFORMIN (GLUCOPHAGE) 1000 MG tablet Take 1,000 mg by mouth 2 (two) times daily with a meal.    omeprazole (PRILOSEC) 20 MG capsule Take  20 mg by mouth 2 (two) times daily.    valsartan (DIOVAN) 160 MG tablet Take 160 mg by mouth daily.      STOP taking these medications     carisoprodol (SOMA) 350 MG tablet      clonazePAM (KLONOPIN) 2 MG tablet      zolpidem (AMBIEN) 10 MG tablet        Allergies  Allergen Reactions  . Ibuprofen Hives      The results of significant diagnostics from this hospitalization (including imaging, microbiology, ancillary and laboratory) are listed below for reference.    Significant Diagnostic Studies: Dg Chest 2 View  03/08/2015   CLINICAL DATA:  Pt was found altered by staff of [employer]. Pt Pt admits to [facility]. Per EMS, pt took  17,600 mg of gabapentin, 14,000 mg of SOMA, 220 mg of Zolpidem, and 60 mg of clonazepam.  EXAM: CHEST  2 VIEW  COMPARISON:  None.  FINDINGS: The heart size and mediastinal contours are within normal limits. Both lungs are clear. There is mild wedge compression deformity of L1, likely chronic.  IMPRESSION: No evidence for acute cardiopulmonary abnormality.   Electronically Signed   By: Norva Pavlov M.D.   On: 03/08/2015 16:00   Ct Head Wo Contrast  03/08/2015   CLINICAL DATA:  Fall and confusion.  EXAM: CT HEAD WITHOUT CONTRAST  CT CERVICAL SPINE WITHOUT CONTRAST  TECHNIQUE: Multidetector CT imaging of the head and cervical spine was performed following the standard protocol without intravenous contrast. Multiplanar CT image reconstructions of the cervical spine were also generated.  COMPARISON:  None.  FINDINGS: CT HEAD FINDINGS  The brain demonstrates no evidence of hemorrhage, infarction, edema, mass effect, extra-axial fluid collection, hydrocephalus or mass lesion. The skull is unremarkable.  CT CERVICAL SPINE FINDINGS  The cervical spine shows normal alignment. There is no evidence of acute fracture or subluxation. No soft tissue swelling or hematoma is identified. There are no significant degenerative changes. No bony or soft tissue lesions are seen. The visualized airway is normally patent.  IMPRESSION: Normal CT studies of the head and cervical spine.   Electronically Signed   By: Irish Lack M.D.   On: 03/08/2015 12:05   Ct Cervical Spine Wo Contrast  03/08/2015   CLINICAL DATA:  Fall and confusion.  EXAM: CT HEAD WITHOUT CONTRAST  CT CERVICAL SPINE WITHOUT CONTRAST  TECHNIQUE: Multidetector CT imaging of the head and cervical spine was performed following the standard protocol without intravenous contrast. Multiplanar CT image reconstructions of the cervical spine were also generated.  COMPARISON:  None.  FINDINGS: CT HEAD FINDINGS  The brain demonstrates no evidence of hemorrhage,  infarction, edema, mass effect, extra-axial fluid collection, hydrocephalus or mass lesion. The skull is unremarkable.  CT CERVICAL SPINE FINDINGS  The cervical spine shows normal alignment. There is no evidence of acute fracture or subluxation. No soft tissue swelling or hematoma is identified. There are no significant degenerative changes. No bony or soft tissue lesions are seen. The visualized airway is normally patent.  IMPRESSION: Normal CT studies of the head and cervical spine.   Electronically Signed   By: Irish Lack M.D.   On: 03/08/2015 12:05    Microbiology: Recent Results (from the past 240 hour(s))  Urine culture     Status: None   Collection Time: 03/08/15  1:30 PM  Result Value Ref Range Status   Specimen Description URINE, CLEAN CATCH  Final   Special Requests NONE  Final   Culture  Final    NO GROWTH 1 DAY Performed at [facility]    Report Status 03/09/2015 FINAL  Final  Culture, blood (routine x 2)     Status: None (Preliminary result)   Collection Time: 03/08/15  3:42 PM  Result Value Ref Range Status   Specimen Description BLOOD LEFT ANTECUBITAL  Final   Special Requests BOTTLES DRAWN AEROBIC AND ANAEROBIC 5 CC EA  Final   Culture   Final    NO GROWTH < 24 HOURS Performed at [facility]    Report Status PENDING  Incomplete  Culture, blood (routine x 2)     Status: None (Preliminary result)   Collection Time: 03/08/15  5:35 PM  Result Value Ref Range Status   Specimen Description BLOOD LEFT ARM  Final   Special Requests BOTTLES DRAWN AEROBIC AND ANAEROBIC  10CC  Final   Culture   Final    NO GROWTH < 24 HOURS Performed at [facility]    Report Status PENDING  Incomplete  MRSA PCR Screening     Status: None   Collection Time: 03/08/15  6:19 PM  Result Value Ref Range Status   MRSA by PCR NEGATIVE NEGATIVE Final    Comment:        The GeneXpert MRSA Assay (FDA approved for NASAL specimens only), is one component of  a comprehensive MRSA colonization surveillance program. It is not intended to diagnose MRSA infection nor to guide or monitor treatment for MRSA infections.      Labs: Basic Metabolic Panel:  Recent Labs Lab 03/08/15 1055 03/09/15 0340 03/10/15 0505  NA 133* 134* 137  K 3.7 3.6 3.4*  CL 97* 100* 105  CO2 25 26 23   GLUCOSE 274* 284* 199*  BUN 9 8 8   CREATININE 0.82 0.79 0.66  CALCIUM 8.8* 8.4* 8.6*  MG 1.5*  --   --    Liver Function Tests:  Recent Labs Lab 03/08/15 1055 03/09/15 0340  AST 33 22  ALT 32 26  ALKPHOS 60 57  BILITOT 0.7 0.7  PROT 7.7 6.9  ALBUMIN 4.5 3.7   No results for input(s): LIPASE, AMYLASE in the last 168 hours. No results for input(s): AMMONIA in the last 168 hours. CBC:  Recent Labs Lab 03/08/15 1246 03/09/15 0340  WBC 11.4* 6.4  NEUTROABS 7.8*  --   HGB 13.0 12.8*  HCT 38.6* 37.7*  MCV 85.6 86.1  PLT 187 187   Cardiac Enzymes:  Recent Labs Lab 03/08/15 1055  CKTOTAL 180   BNP: BNP (last 3 results) No results for input(s): BNP in the last 8760 hours.  ProBNP (last 3 results) No results for input(s): PROBNP in the last 8760 hours.  CBG:  Recent Labs Lab 03/09/15 0832 03/09/15 1307 03/09/15 1643 03/09/15 2118 03/10/15 0743  GLUCAP 258* 275* 235* 160* 200*       Signed:  Debbera Wolken A  [facility] 03/10/2015, 12:26 PM

## 2015-03-13 LAB — CULTURE, BLOOD (ROUTINE X 2)
CULTURE: NO GROWTH
CULTURE: NO GROWTH

## 2015-03-15 ENCOUNTER — Other Ambulatory Visit (INDEPENDENT_AMBULATORY_CARE_PROVIDER_SITE_OTHER): Payer: BC Managed Care – PPO

## 2015-03-15 ENCOUNTER — Ambulatory Visit (INDEPENDENT_AMBULATORY_CARE_PROVIDER_SITE_OTHER): Payer: BC Managed Care – PPO | Admitting: Family

## 2015-03-15 ENCOUNTER — Encounter: Payer: Self-pay | Admitting: Family

## 2015-03-15 ENCOUNTER — Telehealth: Payer: Self-pay | Admitting: Family

## 2015-03-15 VITALS — BP 140/94 | HR 90 | Temp 98.2°F | Resp 18 | Ht 67.0 in | Wt 200.1 lb

## 2015-03-15 DIAGNOSIS — F329 Major depressive disorder, single episode, unspecified: Secondary | ICD-10-CM | POA: Diagnosis not present

## 2015-03-15 DIAGNOSIS — E1165 Type 2 diabetes mellitus with hyperglycemia: Secondary | ICD-10-CM | POA: Diagnosis not present

## 2015-03-15 DIAGNOSIS — I10 Essential (primary) hypertension: Secondary | ICD-10-CM | POA: Diagnosis not present

## 2015-03-15 DIAGNOSIS — F32A Depression, unspecified: Secondary | ICD-10-CM

## 2015-03-15 DIAGNOSIS — IMO0002 Reserved for concepts with insufficient information to code with codable children: Secondary | ICD-10-CM

## 2015-03-15 DIAGNOSIS — G4700 Insomnia, unspecified: Secondary | ICD-10-CM

## 2015-03-15 DIAGNOSIS — F410 Panic disorder [episodic paroxysmal anxiety] without agoraphobia: Secondary | ICD-10-CM

## 2015-03-15 LAB — MICROALBUMIN / CREATININE URINE RATIO
Creatinine,U: 374.1 mg/dL
MICROALB/CREAT RATIO: 0.7 mg/g (ref 0.0–30.0)
Microalb, Ur: 2.8 mg/dL — ABNORMAL HIGH (ref 0.0–1.9)

## 2015-03-15 LAB — BASIC METABOLIC PANEL
BUN: 12 mg/dL (ref 6–23)
CHLORIDE: 100 meq/L (ref 96–112)
CO2: 30 mEq/L (ref 19–32)
Calcium: 9.4 mg/dL (ref 8.4–10.5)
Creatinine, Ser: 0.81 mg/dL (ref 0.40–1.50)
GFR: 109.76 mL/min (ref >60.00)
GLUCOSE: 173 mg/dL — AB (ref 70–99)
POTASSIUM: 3.9 meq/L (ref 3.5–5.1)
SODIUM: 138 meq/L (ref 135–145)

## 2015-03-15 LAB — HEMOGLOBIN A1C: Hgb A1c MFr Bld: 11 % — ABNORMAL HIGH (ref 4.6–6.5)

## 2015-03-15 MED ORDER — METFORMIN HCL 1000 MG PO TABS: 1000.0000 mg | ORAL_TABLET | Freq: Two times a day (BID) | ORAL | Status: DC

## 2015-03-15 MED ORDER — CLONAZEPAM 1 MG PO TABS: 1.0000 mg | ORAL_TABLET | Freq: Every day | ORAL | Status: DC | PRN

## 2015-03-15 MED ORDER — GABAPENTIN 100 MG PO CAPS: 200.0000 mg | ORAL_CAPSULE | Freq: Two times a day (BID) | ORAL | Status: DC

## 2015-03-15 MED ORDER — VALSARTAN 160 MG PO TABS: 160.0000 mg | ORAL_TABLET | Freq: Every day | ORAL | Status: DC

## 2015-03-15 NOTE — Assessment & Plan Note (Signed)
Blood pressure is slightly elevated above goal 140/90 today secondary to not having his Diovan. Refill Diovan. Continue to monitor blood pressure at home as able. Follow-up blood pressure next office visit to determine control.

## 2015-03-15 NOTE — Telephone Encounter (Signed)
Pt is requesting refill of gabapentin. Pt uses [facility] at [address].

## 2015-03-15 NOTE — Assessment & Plan Note (Signed)
Currently maintained on Ambien prescribed by an outside Apolonia Ellwood. Appears stable with current dosage. Continue current dosage of Ambien and changes per outside Evamae Rowen.

## 2015-03-15 NOTE — Assessment & Plan Note (Signed)
Restart Klonopin as needed. Currently stable with minimal need for Klonopin.

## 2015-03-15 NOTE — Telephone Encounter (Signed)
Medication sent to pharmacy  

## 2015-03-15 NOTE — Progress Notes (Signed)
Pre visit review using our clinic review tool, if applicable. No additional management support is needed unless otherwise documented below in the visit note. 

## 2015-03-15 NOTE — Assessment & Plan Note (Addendum)
Stable with current regimen of Lexapro and Seroquel. Denies adverse side effects or suicidal ideation. Recent questionable overdose of his medications of undetermined intent. Continue current dosage of Seroquel and Lexapro. Refer to psychiatry per patient request.

## 2015-03-15 NOTE — Assessment & Plan Note (Signed)
Unknown status of diabetes and currently maintained on metformin and glyburide. Obtain A1c, basic metabolic panel, and microalbumin. Exam is up-to-date. Continue current dosage of metformin and glyburide pending A1c results. Pneumovax status undetermined and will check.

## 2015-03-15 NOTE — Telephone Encounter (Signed)
Please inform patient that his kidney function and electrolytes look good. His A1c however is 11.0 which is poorly controlled diabetes. Therefore I am going to recommend additional medication to regimen. Therefore please have him check with his drug formulary with his insurance as to what medications are covered for diabetes. The only other option is going to be insulin as we alluded to during his visit. While we wait for this information, please have him work on nutrition and exercise through a low carb and low fat diet.

## 2015-03-15 NOTE — Progress Notes (Signed)
Subjective:    Patient ID: Jason [name], male    DOB: Dec 16, 1970, 44 y.o.   MRN: [id number]  Chief Complaint  Patient presents with  . Establish Care    would like a referral to psychiatrist    HPI:  Jason [name] is a 44 y.o. male who  has a past medical history of Back pain; Depression; Chicken pox; Diabetes mellitus without complication; Hypertension; Hyperlipidemia; Kidney stones; and Ulceration of colon. and presents today for an office visit to establish care.    1.) Depression - Previously diagnosed with depression and currently maintained on escitalopram and quetiapine and indicates that his symptoms are fairly well controlled. Takes the medication as prescribed and denies adverse effects or suicidal ideations. Would like to restart his clonazepam as needed for panic attacks and referral to psychiatry.   2.) Diabetes - Currently maintained on metformin and glyburide. Takes the medication as prescribed and denies adverse side effects or hypoglycemic events. Has not been managing his diet very well and is working to improve it. Does not currently take his blood sugar at home. Eye exam is up to date. Unsure of Pneumovax status. Currently taking Diovan. Does not recall last A1c  3.) Insomnia - Currently maintained on the Ambien and has recently run out of the medication. Is working on sleep hygiene. Does report about 6 hours of sleep at night.  Takes the medication as prescribed and denies adverse effects.   Allergies  Allergen Reactions  . Ibuprofen Hives  . Lactose Intolerance (Gi)      Outpatient Prescriptions Prior to Visit  Medication Sig Dispense Refill  . escitalopram (LEXAPRO) 20 MG tablet Take 1 tablet (20 mg total) by mouth daily. 30 tablet 0  . gabapentin (NEURONTIN) 100 MG capsule Take 2 capsules (200 mg total) by mouth 2 (two) times daily. 30 capsule 0  . glyBURIDE (DIABETA) 5 MG tablet Take 5 mg by mouth 2 (two) times daily.    Marland Kitchen omeprazole (PRILOSEC) 20 MG capsule  Take 20 mg by mouth 2 (two) times daily.    . QUEtiapine (SEROQUEL) 50 MG tablet Take 0.5 tablets (25 mg total) by mouth at bedtime. 15 tablet 0  . escitalopram (LEXAPRO) 20 MG tablet Take 20 mg by mouth daily.    . metFORMIN (GLUCOPHAGE) 1000 MG tablet Take 1,000 mg by mouth 2 (two) times daily with a meal.    . valsartan (DIOVAN) 160 MG tablet Take 160 mg by mouth daily.     No facility-administered medications prior to visit.     Past Medical History  Diagnosis Date  . Back pain   . Depression   . Chicken pox   . Diabetes mellitus without complication   . Hypertension   . Hyperlipidemia   . Kidney stones   . Ulceration of colon      Past Surgical History  Procedure Laterality Date  . Back surgery    . Hernia repair       Family History  Problem Relation Age of Onset  . Congestive Heart Failure Father   . Hyperlipidemia Father   . Heart disease Father   . Stroke Father   . Hypertension Father   . Diabetes Father   . Hyperlipidemia Mother   . Heart disease Mother   . Hypertension Mother   . Diabetes Mother      Social History   Social History  . Marital Status: Single    Spouse Name: N/A  . Number of Children: 0  .  Years of Education: 74   Occupational History  . [employer] Director    Social History Main Topics  . Smoking status: Never Smoker   . Smokeless tobacco: Never Used  . Alcohol Use: Yes     Comment: Rarely  . Drug Use: No  . Sexual Activity: Not Currently   Other Topics Concern  . Not on file   Social History Narrative   Fun: Gaming of all types, movies   Denies religious beliefs effecting health care.      Review of Systems  Constitutional: Negative for diaphoresis and fatigue.  Eyes:       Negative for changes in vision.   Respiratory: Negative for chest tightness and shortness of breath.   Cardiovascular: Negative for chest pain, palpitations and leg swelling.  Endocrine: Negative for polydipsia, polyphagia and polyuria.    Neurological: Positive for headaches.  Psychiatric/Behavioral: Negative for sleep disturbance and decreased concentration. The patient is not nervous/anxious.       Objective:    BP 140/94 mmHg  Pulse 90  Temp(Src) 98.2 F (36.8 C) (Oral)  Resp 18  Ht  (1.702 m)  Wt 200 lb 1.9 oz (90.774 kg)  BMI 31.34 kg/m2  SpO2 96% Nursing note and vital signs reviewed.  Physical Exam  Constitutional: He is oriented to person, place, and time. He appears well-developed and well-nourished. No distress.  Cardiovascular: Normal rate, regular rhythm, normal heart sounds and intact distal pulses.   Pulmonary/Chest: Effort normal and breath sounds normal.  Neurological: He is alert and oriented to person, place, and time.  Skin: Skin is warm and dry.  Psychiatric: He has a normal mood and affect. His behavior is normal. Judgment and thought content normal.       Assessment & Plan:   Problem List Items Addressed This Visit      Cardiovascular and Mediastinum   Essential hypertension    Blood pressure is slightly elevated above goal 140/90 today secondary to not having his Diovan. Refill Diovan. Continue to monitor blood pressure at home as able. Follow-up blood pressure next office visit to determine control.      Relevant Medications   valsartan (DIOVAN) 160 MG tablet   Other Relevant Orders   Basic Metabolic Panel (BMET)     Other   Depression - Primary    Stable with current regimen of Lexapro and Seroquel. Denies adverse side effects or suicidal ideation. Recent questionable overdose of his medications of undetermined intent. Continue current dosage of Seroquel and Lexapro. Refer to psychiatry per patient request.      Relevant Orders   Ambulatory referral to Psychiatry   Type 2 diabetes mellitus, uncontrolled    Unknown status of diabetes and currently maintained on metformin and glyburide. Obtain A1c, basic metabolic panel, and microalbumin. Exam is up-to-date. Continue  current dosage of metformin and glyburide pending A1c results. Pneumovax status undetermined and will check.      Relevant Medications   valsartan (DIOVAN) 160 MG tablet   metFORMIN (GLUCOPHAGE) 1000 MG tablet   Other Relevant Orders   Hemoglobin A1c   Basic Metabolic Panel (BMET)   Microalbumin / creatinine urine ratio   Insomnia    Currently maintained on Ambien prescribed by an outside provider. Appears stable with current dosage. Continue current dosage of Ambien and changes per outside provider.      Panic attacks    Restart Klonopin as needed. Currently stable with minimal need for Klonopin.      Relevant Medications  clonazePAM (KLONOPIN) 1 MG tablet

## 2015-03-15 NOTE — Patient Instructions (Addendum)
Thank you for choosing [facility].  Summary/Instructions:  Your prescription(s) have been submitted to your pharmacy or been printed and provided for you. Please take as directed and contact our office if you believe you are having problem(s) with the medication(s) or have any questions.  Please stop by the lab on the basement level of the building for your blood work. Your results will be released to MyChart (or called to you) after review, usually within 72 hours after test completion. If any changes need to be made, you will be notified at that same time.  Referrals have been made during this visit. You should expect to hear back from our schedulers in about 7-10 days in regards to establishing an appointment with the specialists we discussed.   If your symptoms worsen or fail to improve, please contact our office for further instruction, or in case of emergency go directly to the emergency room at the closest medical facility.   Diabetes Mellitus and Food It is important for you to manage your blood sugar (glucose) level. Your blood glucose level can be greatly affected by what you eat. Eating healthier foods in the appropriate amounts throughout the day at about the same time each day will help you control your blood glucose level. It can also help slow or prevent worsening of your diabetes mellitus. Healthy eating may even help you improve the level of your blood pressure and reach or maintain a healthy weight.  HOW CAN FOOD AFFECT ME? Carbohydrates Carbohydrates affect your blood glucose level more than any other type of food. Your dietitian will help you determine how many carbohydrates to eat at each meal and teach you how to count carbohydrates. Counting carbohydrates is important to keep your blood glucose at a healthy level, especially if you are using insulin or taking certain medicines for diabetes mellitus. Alcohol Alcohol can cause sudden decreases in blood glucose  (hypoglycemia), especially if you use insulin or take certain medicines for diabetes mellitus. Hypoglycemia can be a life-threatening condition. Symptoms of hypoglycemia (sleepiness, dizziness, and disorientation) are similar to symptoms of having too much alcohol.  If your health care provider has given you approval to drink alcohol, do so in moderation and use the following guidelines:  Women should not have more than one drink per day, and men should not have more than two drinks per day. One drink is equal to:  12 oz of beer.  5 oz of wine.  1 oz of hard liquor.  Do not drink on an empty stomach.  Keep yourself hydrated. Have water, diet soda, or unsweetened iced tea.  Regular soda, juice, and other mixers might contain a lot of carbohydrates and should be counted. WHAT FOODS ARE NOT RECOMMENDED? As you make food choices, it is important to remember that all foods are not the same. Some foods have fewer nutrients per serving than other foods, even though they might have the same number of calories or carbohydrates. It is difficult to get your body what it needs when you eat foods with fewer nutrients. Examples of foods that you should avoid that are high in calories and carbohydrates but low in nutrients include:  Trans fats (most processed foods list trans fats on the Nutrition Facts label).  Regular soda.  Juice.  Candy.  Sweets, such as cake, pie, doughnuts, and cookies.  Fried foods. WHAT FOODS CAN I EAT? Have nutrient-rich foods, which will nourish your body and keep you healthy. The food you should eat also  will depend on several factors, including:  The calories you need.  The medicines you take.  Your weight.  Your blood glucose level.  Your blood pressure level.  Your cholesterol level. You also should eat a variety of foods, including:  Protein, such as meat, poultry, fish, tofu, nuts, and seeds (lean animal proteins are  best).  Fruits.  Vegetables.  Dairy products, such as milk, cheese, and yogurt (low fat is best).  Breads, grains, pasta, cereal, rice, and beans.  Fats such as olive oil, trans fat-free margarine, canola oil, avocado, and olives. DOES EVERYONE WITH DIABETES MELLITUS HAVE THE SAME MEAL PLAN? Because every person with diabetes mellitus is different, there is not one meal plan that works for everyone. It is very important that you meet with a dietitian who will help you create a meal plan that is just right for you. Document Released: 03/09/2005 Document Revised: 06/17/2013 Document Reviewed: 05/09/2013 [facility] Patient Information 2015 [place], [place]. This information is not intended to replace advice given to you by your health care provider. Make sure you discuss any questions you have with your health care provider.   Diabetes and Exercise Exercising regularly is important. It is not just about losing weight. It has many health benefits, such as:  Improving your overall fitness, flexibility, and endurance.  Increasing your bone density.  Helping with weight control.  Decreasing your body fat.  Increasing your muscle strength.  Reducing stress and tension.  Improving your overall health. People with diabetes who exercise gain additional benefits because exercise:  Reduces appetite.  Improves the body's use of blood sugar (glucose).  Helps lower or control blood glucose.  Decreases blood pressure.  Helps control blood lipids (such as cholesterol and triglycerides).  Improves the body's use of the hormone insulin by:  Increasing the body's insulin sensitivity.  Reducing the body's insulin needs.  Decreases the risk for heart disease because exercising:  Lowers cholesterol and triglycerides levels.  Increases the levels of good cholesterol (such as high-density lipoproteins [HDL]) in the body.  Lowers blood glucose levels. YOUR ACTIVITY PLAN  Choose an  activity that you enjoy and set realistic goals. Your health care provider or diabetes educator can help you make an activity plan that works for you. Exercise regularly as directed by your health care provider. This includes:  Performing resistance training twice a week such as push-ups, sit-ups, lifting weights, or using resistance bands.  Performing 150 minutes of cardio exercises each week such as walking, running, or playing sports.  Staying active and spending no more than 90 minutes at one time being inactive. Even short bursts of exercise are good for you. Three 10-minute sessions spread throughout the day are just as beneficial as a single 30-minute session. Some exercise ideas include:  Taking the dog for a walk.  Taking the stairs instead of the elevator.  Dancing to your favorite song.  Doing an exercise video.  Doing your favorite exercise with a friend. RECOMMENDATIONS FOR EXERCISING WITH TYPE 1 OR TYPE 2 DIABETES   Check your blood glucose before exercising. If blood glucose levels are greater than 240 mg/dL, check for urine ketones. Do not exercise if ketones are present.  Avoid injecting insulin into areas of the body that are going to be exercised. For example, avoid injecting insulin into:  The arms when playing tennis.  The legs when jogging.  Keep a record of:  Food intake before and after you exercise.  Expected peak times of insulin action.  Blood glucose levels before and after you exercise.  The type and amount of exercise you have done.  Review your records with your health care provider. Your health care provider will help you to develop guidelines for adjusting food intake and insulin amounts before and after exercising.  If you take insulin or oral hypoglycemic agents, watch for signs and symptoms of hypoglycemia. They include:  Dizziness.  Shaking.  Sweating.  Chills.  Confusion.  Drink plenty of water while you exercise to prevent  dehydration or heat stroke. Body water is lost during exercise and must be replaced.  Talk to your health care provider before starting an exercise program to make sure it is safe for you. Remember, almost any type of activity is better than none. Document Released: 09/02/2003 Document Revised: 10/27/2013 Document Reviewed: 11/19/2012 [facility] Patient Information 2015 [place], [place]. This information is not intended to replace advice given to you by your health care provider. Make sure you discuss any questions you have with your health care provider.

## 2015-03-17 NOTE — Telephone Encounter (Signed)
Pt aware of results 

## 2015-04-05 ENCOUNTER — Emergency Department (HOSPITAL_COMMUNITY): Payer: BC Managed Care – PPO

## 2015-04-05 ENCOUNTER — Encounter (HOSPITAL_COMMUNITY): Payer: Self-pay | Admitting: *Deleted

## 2015-04-05 ENCOUNTER — Emergency Department (HOSPITAL_COMMUNITY)
Admission: EM | Admit: 2015-04-05 | Discharge: 2015-04-06 | Disposition: A | Discharge status: Other Acute Inpatient Hospital | Payer: BC Managed Care – PPO | Attending: Emergency Medicine | Admitting: Emergency Medicine

## 2015-04-05 DIAGNOSIS — F329 Major depressive disorder, single episode, unspecified: Secondary | ICD-10-CM | POA: Diagnosis not present

## 2015-04-05 DIAGNOSIS — I10 Essential (primary) hypertension: Secondary | ICD-10-CM | POA: Insufficient documentation

## 2015-04-05 DIAGNOSIS — Z8719 Personal history of other diseases of the digestive system: Secondary | ICD-10-CM | POA: Insufficient documentation

## 2015-04-05 DIAGNOSIS — Z79899 Other long term (current) drug therapy: Secondary | ICD-10-CM | POA: Diagnosis not present

## 2015-04-05 DIAGNOSIS — T424X2A Poisoning by benzodiazepines, intentional self-harm, initial encounter: Secondary | ICD-10-CM | POA: Insufficient documentation

## 2015-04-05 DIAGNOSIS — R4182 Altered mental status, unspecified: Secondary | ICD-10-CM | POA: Insufficient documentation

## 2015-04-05 DIAGNOSIS — Z8619 Personal history of other infectious and parasitic diseases: Secondary | ICD-10-CM | POA: Diagnosis not present

## 2015-04-05 DIAGNOSIS — F333 Major depressive disorder, recurrent, severe with psychotic symptoms: Secondary | ICD-10-CM | POA: Diagnosis present

## 2015-04-05 DIAGNOSIS — F1310 Sedative, hypnotic or anxiolytic abuse, uncomplicated: Secondary | ICD-10-CM | POA: Diagnosis not present

## 2015-04-05 DIAGNOSIS — E119 Type 2 diabetes mellitus without complications: Secondary | ICD-10-CM | POA: Insufficient documentation

## 2015-04-05 DIAGNOSIS — Z87442 Personal history of urinary calculi: Secondary | ICD-10-CM | POA: Diagnosis not present

## 2015-04-05 DIAGNOSIS — T1491 Suicide attempt: Secondary | ICD-10-CM | POA: Diagnosis not present

## 2015-04-05 DIAGNOSIS — R000 Tachycardia, unspecified: Secondary | ICD-10-CM | POA: Diagnosis not present

## 2015-04-05 LAB — COMPREHENSIVE METABOLIC PANEL
ALBUMIN: 4.2 g/dL (ref 3.5–5.0)
ALK PHOS: 95 U/L (ref 38–126)
ALT: 36 U/L (ref 17–63)
AST: 33 U/L (ref 15–41)
Anion gap: 9 (ref 5–15)
BILIRUBIN TOTAL: 0.5 mg/dL (ref 0.3–1.2)
BUN: 11 mg/dL (ref 6–20)
CALCIUM: 8.8 mg/dL — AB (ref 8.9–10.3)
CO2: 29 mmol/L (ref 22–32)
Chloride: 99 mmol/L — ABNORMAL LOW (ref 101–111)
Creatinine, Ser: 0.77 mg/dL (ref 0.61–1.24)
GFR calc Af Amer: >60 mL/min (ref >60)
GFR calc non Af Amer: >60 mL/min (ref >60)
GLUCOSE: 223 mg/dL — AB (ref 65–99)
Potassium: 4.1 mmol/L (ref 3.5–5.1)
SODIUM: 137 mmol/L (ref 135–145)
TOTAL PROTEIN: 8 g/dL (ref 6.5–8.1)

## 2015-04-05 LAB — RAPID URINE DRUG SCREEN, HOSP PERFORMED
Amphetamines: NOT DETECTED
Barbiturates: NOT DETECTED
Benzodiazepines: POSITIVE — AB
COCAINE: NOT DETECTED
OPIATES: NOT DETECTED
Tetrahydrocannabinol: NOT DETECTED

## 2015-04-05 LAB — I-STAT CG4 LACTIC ACID, ED
Lactic Acid, Venous: 3.04 mmol/L (ref 0.5–2.0)
Lactic Acid, Venous: 2.04 mmol/L (ref 0.5–2.0)

## 2015-04-05 LAB — I-STAT TROPONIN, ED: TROPONIN I, POC: 0 ng/mL (ref 0.00–0.08)

## 2015-04-05 LAB — CBC WITH DIFFERENTIAL/PLATELET
BASOS ABS: 0 10*3/uL (ref 0.0–0.1)
Basophils Relative: 0 %
EOS PCT: 1 %
Eosinophils Absolute: 0.1 10*3/uL (ref 0.0–0.7)
HEMATOCRIT: 41.4 % (ref 39.0–52.0)
Hemoglobin: 14 g/dL (ref 13.0–17.0)
LYMPHS ABS: 2.5 10*3/uL (ref 0.7–4.0)
LYMPHS PCT: 31 %
MCH: 29.3 pg (ref 26.0–34.0)
MCHC: 33.8 g/dL (ref 30.0–36.0)
MCV: 86.6 fL (ref 78.0–100.0)
MONO ABS: 0.6 10*3/uL (ref 0.1–1.0)
Monocytes Relative: 8 %
NEUTROS ABS: 4.9 10*3/uL (ref 1.7–7.7)
Neutrophils Relative %: 60 %
PLATELETS: 205 10*3/uL (ref 150–400)
RBC: 4.78 MIL/uL (ref 4.22–5.81)
RDW: 13.3 % (ref 11.5–15.5)
WBC: 8.1 10*3/uL (ref 4.0–10.5)

## 2015-04-05 LAB — PROTIME-INR
INR: 1.07 (ref 0.00–1.49)
Prothrombin Time: 14.1 seconds (ref 11.6–15.2)

## 2015-04-05 LAB — ETHANOL: Alcohol, Ethyl (B): <5 mg/dL (ref <5)

## 2015-04-05 LAB — ACETAMINOPHEN LEVEL: Acetaminophen (Tylenol), Serum: <10 ug/mL — ABNORMAL LOW (ref 10–30)

## 2015-04-05 LAB — SALICYLATE LEVEL

## 2015-04-05 MED ORDER — METFORMIN HCL 500 MG PO TABS
1000.0000 mg | ORAL_TABLET | Freq: Two times a day (BID) | ORAL | Status: DC
  Administered 2015-04-06 (×2): 1000 mg via ORAL
  Filled 2015-04-05 (×3): qty 2

## 2015-04-05 MED ORDER — IRBESARTAN 75 MG PO TABS
37.5000 mg | ORAL_TABLET | Freq: Every day | ORAL | Status: DC
  Administered 2015-04-06: 37.5 mg via ORAL
  Filled 2015-04-05: qty 0.5

## 2015-04-05 MED ORDER — GABAPENTIN 100 MG PO CAPS
100.0000 mg | ORAL_CAPSULE | Freq: Three times a day (TID) | ORAL | Status: DC | PRN
  Administered 2015-04-05: 100 mg via ORAL
  Filled 2015-04-05: qty 1

## 2015-04-05 MED ORDER — PANTOPRAZOLE SODIUM 40 MG PO TBEC
40.0000 mg | DELAYED_RELEASE_TABLET | Freq: Every day | ORAL | Status: DC
  Administered 2015-04-05 – 2015-04-06 (×2): 40 mg via ORAL
  Filled 2015-04-05 (×2): qty 1

## 2015-04-05 MED ORDER — GLYBURIDE 5 MG PO TABS
5.0000 mg | ORAL_TABLET | Freq: Two times a day (BID) | ORAL | Status: DC
  Administered 2015-04-06 (×2): 5 mg via ORAL
  Filled 2015-04-05 (×3): qty 1

## 2015-04-05 MED ORDER — QUETIAPINE FUMARATE 50 MG PO TABS
50.0000 mg | ORAL_TABLET | Freq: Every day | ORAL | Status: DC
  Administered 2015-04-05: 50 mg via ORAL
  Filled 2015-04-05: qty 1

## 2015-04-05 MED ORDER — SODIUM CHLORIDE 0.9 % IV BOLUS (SEPSIS)
1000.0000 mL | Freq: Once | INTRAVENOUS | Status: AC
  Administered 2015-04-05: 1000 mL via INTRAVENOUS

## 2015-04-05 MED ORDER — ESCITALOPRAM OXALATE 10 MG PO TABS
20.0000 mg | ORAL_TABLET | Freq: Every day | ORAL | Status: DC
  Administered 2015-04-05 – 2015-04-06 (×2): 20 mg via ORAL
  Filled 2015-04-05 (×2): qty 2

## 2015-04-05 NOTE — ED Notes (Signed)
MD notified patient wants to leave, MD requested patient be given food/drink. MD will come to speak to patient. Patient updated, agreeable at this time.

## 2015-04-05 NOTE — ED Notes (Signed)
Per EMS, pt from home, reports they were called out for AMS, upon arrival, pt was found disoriented.  Found out to have an empty bottle of clonazepam, it was filled 10/1 with 90tabs.

## 2015-04-05 NOTE — ED Notes (Signed)
MD at bedside. 

## 2015-04-05 NOTE — ED Notes (Signed)
Notified edp Little of Istat lacticacid results

## 2015-04-05 NOTE — ED Provider Notes (Signed)
CSN: [id number]     Arrival date & time 04/05/15  1522 History   First MD Initiated Contact with Patient 04/05/15 1546     Chief Complaint  Patient presents with  . Drug Overdose     (Consider location/radiation/quality/duration/timing/severity/associated sxs/prior Treatment) HPI Comments: 44 year old male with past medical history including hypertension, hyperlipidemia type 2 diabetes mellitus, depression, chronic pain who presents with altered mental status. History limited due to the patient's AMS and obtained primarily from EMS. EMS reports that they were called to the patient's home by someone who was concerned for altered mental status. The patient is unable to clarify who called EMS but states that his boss called from work to see where he was. He thought that it was Sunday. On EMS arrival, the patient was disoriented with an empty bottle of clonazepam. The prescription was for 90  tablets and was filled on 10/1. The patient denies any intentional overdose on any of his medications. He states that he took all of his medications as prescribed. He endorses depression since having a divorce but denies any SI or HI. He denies alcohol or drug use. He does state that he fell earlier today and hit his head. He is unable to recall further details.  Patient is a 44 y.o. male presenting with Overdose. The history is provided by the EMS personnel.  Drug Overdose    Past Medical History  Diagnosis Date  . Back pain   . Depression   . Chicken pox   . Diabetes mellitus without complication (HCC)   . Hypertension   . Hyperlipidemia   . Kidney stones   . Ulceration of colon    Past Surgical History  Procedure Laterality Date  . Back surgery    . Hernia repair     Family History  Problem Relation Age of Onset  . Congestive Heart Failure Father   . Hyperlipidemia Father   . Heart disease Father   . Stroke Father   . Hypertension Father   . Diabetes Father   . Hyperlipidemia Mother    . Heart disease Mother   . Hypertension Mother   . Diabetes Mother    Social History  Substance Use Topics  . Smoking status: Never Smoker   . Smokeless tobacco: Never Used  . Alcohol Use: Yes     Comment: Rarely    Review of Systems  Unable to perform ROS: Mental status change      Allergies  Ibuprofen; Lactose intolerance (gi); and Tape  Home Medications   Prior to Admission medications   Medication Sig Start Date End Date Taking? Authorizing Provider  carisoprodol (SOMA) 350 MG tablet Take 350 mg by mouth 4 (four) times daily as needed for muscle spasms.   Yes Historical Provider, MD  clonazePAM (KLONOPIN) 1 MG tablet Take 1 tablet (1 mg total) by mouth daily as needed for anxiety. 03/15/15  Yes Veryl Speak, FNP  escitalopram (LEXAPRO) 20 MG tablet Take 1 tablet (20 mg total) by mouth daily. 03/10/15  Yes Belkys A Regalado, MD  gabapentin (NEURONTIN) 100 MG capsule Take 2 capsules (200 mg total) by mouth 2 (two) times daily. Patient taking differently: Take 100 mg by mouth 3 (three) times daily as needed (for nerve pain).  03/15/15  Yes Veryl Speak, FNP  glyBURIDE (DIABETA) 5 MG tablet Take 5 mg by mouth 2 (two) times daily.   Yes Historical Provider, MD  metFORMIN (GLUCOPHAGE) 1000 MG tablet Take 1 tablet (1,000 mg total)  by mouth 2 (two) times daily with a meal. 03/15/15  Yes Veryl Speak, FNP  omeprazole (PRILOSEC) 20 MG capsule Take 20 mg by mouth daily.    Yes Historical Provider, MD  QUEtiapine (SEROQUEL) 50 MG tablet Take 0.5 tablets (25 mg total) by mouth at bedtime. Patient taking differently: Take 50 mg by mouth at bedtime.  03/10/15  Yes Belkys A Regalado, MD  valsartan (DIOVAN) 160 MG tablet Take 1 tablet (160 mg total) by mouth daily. 03/15/15  Yes Veryl Speak, FNP   BP 147/90 mmHg  Pulse 105  Temp(Src) 98 F (36.7 C) (Oral)  Resp 16  SpO2 98% Physical Exam  Constitutional: He appears well-developed and well-nourished. No distress.  Sleepy  but awake  HENT:  Head: Normocephalic and atraumatic.  Mouth/Throat: Oropharynx is clear and moist.  Moist mucous membranes  Eyes: Conjunctivae are normal. Pupils are equal, round, and reactive to light.  Neck: Neck supple.  Cardiovascular: Regular rhythm and normal heart sounds.   No murmur heard. Tachycardic  Pulmonary/Chest: Effort normal and breath sounds normal.  Abdominal: Soft. Bowel sounds are normal. He exhibits no distension. There is no tenderness.  Musculoskeletal: He exhibits no edema.  Neurological: He is alert. He exhibits normal muscle tone.  Oriented to person and place, slurred speech with extremely slow response time to questioning, moving all 4 extremities  Skin: Skin is warm and dry.  Psychiatric:  Avoids eye contact  Nursing note and vitals reviewed.   ED Course  Procedures (including critical care time) Labs Review Labs Reviewed  COMPREHENSIVE METABOLIC PANEL - Abnormal; Notable for the following:    Chloride 99 (*)    Glucose, Bld 223 (*)    Calcium 8.8 (*)    All other components within normal limits  ACETAMINOPHEN LEVEL - Abnormal; Notable for the following:    Acetaminophen (Tylenol), Serum <10 (*)    All other components within normal limits  URINE RAPID DRUG SCREEN, HOSP PERFORMED - Abnormal; Notable for the following:    Benzodiazepines POSITIVE (*)    All other components within normal limits  I-STAT CG4 LACTIC ACID, ED - Abnormal; Notable for the following:    Lactic Acid, Venous 3.04 (*)    All other components within normal limits  I-STAT CG4 LACTIC ACID, ED - Abnormal; Notable for the following:    Lactic Acid, Venous 2.04 (*)    All other components within normal limits  ETHANOL  SALICYLATE LEVEL  CBC WITH DIFFERENTIAL/PLATELET  PROTIME-INR  I-STAT TROPOININ, ED    Imaging Review Ct Head Wo Contrast  04/05/2015   CLINICAL DATA:  Mental status changes.  Fell and hit head.  EXAM: CT HEAD WITHOUT CONTRAST  TECHNIQUE: Contiguous  axial images were obtained from the base of the skull through the vertex without intravenous contrast.  COMPARISON:  03/08/2015  FINDINGS: Sinuses/Soft tissues: Hypoplastic right frontal sinus. Clear mastoid air cells.  Intracranial: No mass lesion, hemorrhage, hydrocephalus, acute infarct, intra-axial, or extra-axial fluid collection.  IMPRESSION: Normal head CT.   Electronically Signed   By: Jeronimo Greaves M.D.   On: 04/05/2015 17:50   I have personally reviewed and evaluated these lab results as part of my medical decision-making.   EKG Interpretation   Date/Time:  Monday April 05 2015 15:40:04 EDT Ventricular Rate:  109 PR Interval:  132 QRS Duration: 78 QT Interval:  333 QTC Calculation: 448 R Axis:   10 Text Interpretation:  Sinus tachycardia SINCE LAST TRACING HEART RATE HAS  INCREASED Confirmed by Ethelda Chick  MD, SAM [phone]) on 04/05/2015 3:43:02  PM      MDM   Final diagnoses:  Intentional clonazepam overdose, initial encounter [facility])     44 year old male who presents by EMS for altered mental status. Concern for possible overdose on clonazepam as an empty pill bottle was found at his home. On arrival, the patient was sleepy but awake and in no acute distress. Vital signs notable for mild hypertension and mild tachycardia. He was disoriented with slurred speech on exam but no other neurologic deficits noted. Obtained labs including tox workup with Tylenol, salicylate, UDS, ethanol levels. Obtained head CT given the patient's report of fall and his AMS. EKG on arrival showed sinus tachycardia with normal QTC and no other abnormalities. I spoke with poison control who recommended supportive care, above evaluation, and 4-6 hour observation for resolution of symptoms.  Labs notable for initial lactate of 3.04 which improved to 2 after 1 L of IV fluids. The remainder of his lab work was notable only for hyperglycemia at 223 and UDS positive for benzodiazepines. On reexamination several  hours after arrival, the patient was awake and neurologically intact. HEENT all other had slurred speech. On repeat questioning, the patient did admit to taking the bottle of clonazepam. He stated that he has had a lot of emotional distress after his divorce from wife of 20 years. I am concerned about his intentional overdose. I have consulted the psychiatry team for further evaluation and his disposition will be determined by their recommendations.  Laurence Spates, MD 04/05/15 2124

## 2015-04-05 NOTE — ED Notes (Signed)
Patient wanded by security. 

## 2015-04-05 NOTE — ED Notes (Signed)
Patient is alert and oriented. He knows he is in the hospital just doesn't know which one. Informed patient he was at [facility] in [place]. He states he is here because "my ex-wife text me and said she was remarried and I felt depressed and took all of my klonopin over 2 days." When asked if he was trying to end his life he stated "No I was just depressed and stressed." Patient states he attempted suicide 3 years ago and decided he did not want to do that because his mother had already lost one son. Patient denies SI/HI and contracts for safety at this time. Q 15 minute checks initiated and in progress. Patient is resting in room with eyes open. Patient offered something to drink and food if he was hungry and he declined both. Monitoring continues.
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
## 2015-04-05 NOTE — ED Notes (Signed)
Pt alert and oriented. Denies SI/HI.

## 2015-04-05 NOTE — ED Notes (Addendum)
Pt has in belonging bag: Light brown khakis short, black belt, yellow t-shirt, pair of brown shoes, Kia Fob and keys, white bag of medicine.

## 2015-04-05 NOTE — ED Notes (Signed)
Bed: [facility] Expected date:  Expected time:  Means of arrival:  Comments: EMS/?Benzo OD/43M

## 2015-04-05 NOTE — ED Notes (Signed)
Bed: [facility] Expected date:  Expected time:  Means of arrival:  Comments: Rm 15

## 2015-04-06 ENCOUNTER — Encounter (HOSPITAL_COMMUNITY): Payer: Self-pay

## 2015-04-06 ENCOUNTER — Inpatient Hospital Stay (HOSPITAL_COMMUNITY)
Admission: AD | Admit: 2015-04-06 | Discharge: 2015-04-09 | DRG: 885 | Disposition: A | Discharge status: Home / Self Care | Payer: BC Managed Care – PPO | Source: Intra-hospital | Attending: Psychiatry | Admitting: Psychiatry

## 2015-04-06 DIAGNOSIS — IMO0002 Reserved for concepts with insufficient information to code with codable children: Secondary | ICD-10-CM

## 2015-04-06 DIAGNOSIS — F411 Generalized anxiety disorder: Secondary | ICD-10-CM | POA: Diagnosis present

## 2015-04-06 DIAGNOSIS — F1310 Sedative, hypnotic or anxiolytic abuse, uncomplicated: Secondary | ICD-10-CM | POA: Diagnosis present

## 2015-04-06 DIAGNOSIS — E1165 Type 2 diabetes mellitus with hyperglycemia: Secondary | ICD-10-CM

## 2015-04-06 DIAGNOSIS — I10 Essential (primary) hypertension: Secondary | ICD-10-CM

## 2015-04-06 DIAGNOSIS — T424X2A Poisoning by benzodiazepines, intentional self-harm, initial encounter: Secondary | ICD-10-CM | POA: Diagnosis not present

## 2015-04-06 DIAGNOSIS — T1491 Suicide attempt: Secondary | ICD-10-CM | POA: Diagnosis not present

## 2015-04-06 DIAGNOSIS — F332 Major depressive disorder, recurrent severe without psychotic features: Principal | ICD-10-CM | POA: Diagnosis present

## 2015-04-06 DIAGNOSIS — F333 Major depressive disorder, recurrent, severe with psychotic symptoms: Secondary | ICD-10-CM | POA: Diagnosis not present

## 2015-04-06 DIAGNOSIS — F329 Major depressive disorder, single episode, unspecified: Secondary | ICD-10-CM | POA: Diagnosis present

## 2015-04-06 DIAGNOSIS — E119 Type 2 diabetes mellitus without complications: Secondary | ICD-10-CM | POA: Diagnosis present

## 2015-04-06 DIAGNOSIS — F410 Panic disorder [episodic paroxysmal anxiety] without agoraphobia: Secondary | ICD-10-CM | POA: Diagnosis not present

## 2015-04-06 LAB — GLUCOSE, CAPILLARY: Glucose-Capillary: 157 mg/dL — ABNORMAL HIGH (ref 65–99)

## 2015-04-06 MED ORDER — ESCITALOPRAM OXALATE 20 MG PO TABS
20.0000 mg | ORAL_TABLET | Freq: Every day | ORAL | Status: DC
  Administered 2015-04-07 – 2015-04-09 (×3): 20 mg via ORAL
  Filled 2015-04-06 (×4): qty 1

## 2015-04-06 MED ORDER — CARISOPRODOL 350 MG PO TABS
350.0000 mg | ORAL_TABLET | Freq: Three times a day (TID) | ORAL | Status: DC
  Administered 2015-04-06 – 2015-04-09 (×8): 350 mg via ORAL
  Filled 2015-04-06 (×9): qty 1

## 2015-04-06 MED ORDER — INSULIN ASPART 100 UNIT/ML ~~LOC~~ SOLN
0.0000 [IU] | Freq: Three times a day (TID) | SUBCUTANEOUS | Status: DC
  Administered 2015-04-07 (×3): 3 [IU] via SUBCUTANEOUS
  Administered 2015-04-08: 11 [IU] via SUBCUTANEOUS
  Administered 2015-04-09: 3 [IU] via SUBCUTANEOUS

## 2015-04-06 MED ORDER — MAGNESIUM HYDROXIDE 400 MG/5ML PO SUSP: 30.0000 mL | Freq: Every day | ORAL | Status: DC | PRN

## 2015-04-06 MED ORDER — ACETAMINOPHEN 325 MG PO TABS
650.0000 mg | ORAL_TABLET | Freq: Four times a day (QID) | ORAL | Status: DC | PRN
Start: 2015-04-06 — End: 2015-04-09
  Administered 2015-04-08: 650 mg via ORAL
  Filled 2015-04-06: qty 2

## 2015-04-06 MED ORDER — ALUM & MAG HYDROXIDE-SIMETH 200-200-20 MG/5ML PO SUSP: 30.0000 mL | ORAL | Status: DC | PRN

## 2015-04-06 MED ORDER — ACETAMINOPHEN 325 MG PO TABS: 650.0000 mg | ORAL_TABLET | Freq: Four times a day (QID) | ORAL | Status: DC | PRN

## 2015-04-06 MED ORDER — GABAPENTIN 100 MG PO CAPS: 100.0000 mg | ORAL_CAPSULE | Freq: Three times a day (TID) | ORAL | Status: DC | PRN

## 2015-04-06 MED ORDER — PANTOPRAZOLE SODIUM 40 MG PO TBEC
40.0000 mg | DELAYED_RELEASE_TABLET | Freq: Every day | ORAL | Status: DC
  Administered 2015-04-07 – 2015-04-09 (×3): 40 mg via ORAL
  Filled 2015-04-06 (×4): qty 1

## 2015-04-06 MED ORDER — GLYBURIDE 5 MG PO TABS
5.0000 mg | ORAL_TABLET | Freq: Two times a day (BID) | ORAL | Status: DC
  Administered 2015-04-06 – 2015-04-09 (×6): 5 mg via ORAL
  Filled 2015-04-06 (×9): qty 1

## 2015-04-06 MED ORDER — INFLUENZA VAC SPLIT QUAD 0.5 ML IM SUSY
0.5000 mL | PREFILLED_SYRINGE | INTRAMUSCULAR | Status: AC
  Administered 2015-04-07: 0.5 mL via INTRAMUSCULAR
  Filled 2015-04-06: qty 0.5

## 2015-04-06 MED ORDER — METFORMIN HCL 500 MG PO TABS
1000.0000 mg | ORAL_TABLET | Freq: Two times a day (BID) | ORAL | Status: DC
  Administered 2015-04-07 – 2015-04-09 (×5): 1000 mg via ORAL
  Filled 2015-04-06 (×7): qty 2

## 2015-04-06 MED ORDER — IRBESARTAN 150 MG PO TABS
150.0000 mg | ORAL_TABLET | Freq: Every day | ORAL | Status: DC
  Administered 2015-04-07 – 2015-04-09 (×3): 150 mg via ORAL
  Filled 2015-04-06 (×4): qty 1

## 2015-04-06 MED ORDER — QUETIAPINE FUMARATE 50 MG PO TABS
50.0000 mg | ORAL_TABLET | Freq: Every day | ORAL | Status: DC
  Administered 2015-04-06 – 2015-04-08 (×3): 50 mg via ORAL
  Filled 2015-04-06 (×6): qty 1

## 2015-04-06 NOTE — Consult Note (Signed)
[facility] Consult   Reason for Consult:  Suicide attempt, OD on Benzodiazepine Referring Physician:  EDP Patient Identification: Jason [name] MRN:  [id number] Principal Diagnosis: Major depressive disorder, recurrent, severe with psychotic features [facility]) Diagnosis:   Patient Active Problem List   Diagnosis Date Noted  . Major depressive disorder, recurrent, severe with psychotic features ([place]) [F33.3] 04/06/2015  . Depression [F32.9] 03/15/2015  . Type 2 diabetes mellitus, uncontrolled ([place]) [E11.65] 03/15/2015  . Essential hypertension [I10] 03/15/2015  . Insomnia [G47.00] 03/15/2015  . Panic attacks [F41.0] 03/15/2015  . Overdose [T50.901A] 03/08/2015  . Lactic acidosis [E87.2] 03/08/2015  . Hyponatremia [E87.1] 03/08/2015  . Hypomagnesemia [E83.42] 03/08/2015    Total Time spent with patient: 1 hour  Subjective:   Jason [name] is a 44 y.o. male patient admitted with Suicide attempt, OD on Benzoduiazepine.  HPI:  Caucasian male, 44 years old was evaluated after he attempted suicide by ingesting 40 tablets of 1 mg Klonopin yesterday.  Patient states he did not take the pills at once and reported that he took the pills over a period of 3-4 days.  He also stated that his intention was not to kill self.  Patient admitted to a previous suicide attempt 3 years ago after he and his wife divorced.  Patient states that his stressor this time was when his ex-wife called and informed him that she was getting married to another.  Patient states that news of the married made him sad and he started taking Klonopin 40 tablets over 3 -4 days.  Patient has a hx of Depression and is taking medications.  He denies SI/HI/AVH at this time.  He has been accepted for admission and we will be seeking placement.  Past Psychiatric History:  MDD  Risk to Self: Suicidal Ideation: No Suicidal Intent: No (No intention to kill self when he took too many clonazepam) Is patient at risk for suicide?:  No Suicidal Plan?: No Access to Means: Yes Specify Access to Suicidal Means: Medications What has been your use of drugs/alcohol within the last 12 months?: ETOH <1x/M How many times?:  (Once 3 years ago.) Other Self Harm Risks: None Triggers for Past Attempts: Spouse contact (Three years ago divorced wite.) Intentional Self Injurious Behavior: None Risk to Others: Homicidal Ideation: No Thoughts of Harm to Others: No Current Homicidal Intent: No Current Homicidal Plan: No Access to Homicidal Means: No Identified Victim: No one History of harm to others?: No Assessment of Violence: None Noted Violent Behavior Description: None reported Does patient have access to weapons?: No Criminal Charges Pending?: No Does patient have a court date: No Prior Inpatient Therapy: Prior Inpatient Therapy: Yes Prior Therapy Dates: 3 years ago Prior Therapy Facilty/Provider(s): Facility in [place] Reason for Treatment: suicide attempt Prior Outpatient Therapy: Prior Outpatient Therapy: No Prior Therapy Dates: None Prior Therapy Facilty/Provider(s): None Reason for Treatment: None Does patient have an ACCT team?: No Does patient have Intensive In-House Services?  : No Does patient have [facility] services? : No Does patient have P4CC services?: No  Past Medical History:  Past Medical History  Diagnosis Date  . Back pain   . Depression   . Chicken pox   . Diabetes mellitus without complication ([place])   . Hypertension   . Hyperlipidemia   . Kidney stones   . Ulceration of colon     Past Surgical History  Procedure Laterality Date  . Back surgery    . Hernia repair     Family  History:  Family History  Problem Relation Age of Onset  . Congestive Heart Failure Father   . Hyperlipidemia Father   . Heart disease Father   . Stroke Father   . Hypertension Father   . Diabetes Father   . Hyperlipidemia Mother   . Heart disease Mother   . Hypertension Mother   . Diabetes Mother     Family Psychiatric  History: Father was diagnosed with  Bipolar disorder Social History:  History  Alcohol Use  . Yes    Comment: Rarely     History  Drug Use No    Social History   Social History  . Marital Status: Single    Spouse Name: N/A  . Number of Children: 0  . 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 Smoking status: Never Smoker   . Smokeless tobacco: Never Used  . Alcohol Use: Yes     Comment: Rarely  . Drug Use: No  . Sexual Activity: Not Currently   Other Topics Concern  . None   Social History Narrative   Fun: Gaming of all types, movies   Denies religious beliefs effecting health care.    Additional Social History:    Pain Medications: Soma Prescriptions: Clonazepam, other medications see PTA medication list Over the Counter: See PTAmedication list History of alcohol / drug use?: No history of alcohol / drug abuse                     Allergies:   Allergies  Allergen Reactions  . Ibuprofen Hives  . Lactose Intolerance (Gi)   . Tape Itching, Rash and Other (See Comments)    Paper tape    Labs:  Results for orders placed or performed during the hospital encounter of 04/05/15 (from the past 48 hour(s))  Comprehensive metabolic panel     Status: Abnormal   Collection Time: 04/05/15  4:16 PM  Result Value Ref Range   Sodium 137 135 - 145 mmol/L   Potassium 4.1 3.5 - 5.1 mmol/L   Chloride 99 (L) 101 - 111 mmol/L   CO2 29 22 - 32 mmol/L   Glucose, Bld 223 (H) 65 - 99 mg/dL   BUN 11 6 - 20 mg/dL   Creatinine, Ser 0.77 0.61 - 1.24 mg/dL   Calcium 8.8 (L) 8.9 - 10.3 mg/dL   Total Protein 8.0 6.5 - 8.1 g/dL   Albumin 4.2 3.5 - 5.0 g/dL   AST 33 15 - 41 U/L   ALT 36 17 - 63 U/L   Alkaline Phosphatase 95 38 - 126 U/L   Total Bilirubin 0.5 0.3 - 1.2 mg/dL   GFR calc non Af Amer >60 >60 mL/min   GFR calc Af Amer >60 >60 mL/min    Comment: (NOTE) The eGFR has been calculated  using the CKD EPI equation. This calculation has not been validated in all clinical situations. eGFR's persistently <60 mL/min signify possible Chronic Kidney Disease.    Anion gap 9 5 - 15  Ethanol     Status: None   Collection Time: 04/05/15  4:16 PM  Result Value Ref Range   Alcohol, Ethyl (B) <5 <5 mg/dL    Comment:        LOWEST DETECTABLE LIMIT FOR SERUM ALCOHOL IS 5 mg/dL FOR MEDICAL PURPOSES ONLY   Acetaminophen level     Status: Abnormal   Collection Time: 04/05/15  4:16 PM  Result Value Ref Range   Acetaminophen (Tylenol), Serum <10 (L) 10 - 30 ug/mL    Comment:        THERAPEUTIC CONCENTRATIONS VARY SIGNIFICANTLY. A RANGE OF 10-30 ug/mL MAY BE AN EFFECTIVE CONCENTRATION FOR MANY PATIENTS. HOWEVER, SOME ARE BEST TREATED AT CONCENTRATIONS OUTSIDE THIS RANGE. ACETAMINOPHEN CONCENTRATIONS >150 ug/mL AT 4 HOURS AFTER INGESTION AND >50 ug/mL AT 12 HOURS AFTER INGESTION ARE OFTEN ASSOCIATED WITH TOXIC REACTIONS.   Salicylate level     Status: None   Collection Time: 04/05/15  4:16 PM  Result Value Ref Range   Salicylate Lvl <4.3 2.8 - 30.0 mg/dL  CBC with Differential     Status: None   Collection Time: 04/05/15  4:16 PM  Result Value Ref Range   WBC 8.1 4.0 - 10.5 K/uL   RBC 4.78 4.22 - 5.81 MIL/uL   Hemoglobin 14.0 13.0 - 17.0 g/dL   HCT 41.4 39.0 - 52.0 %   MCV 86.6 78.0 - 100.0 fL   MCH 29.3 26.0 - 34.0 pg   MCHC 33.8 30.0 - 36.0 g/dL   RDW 13.3 11.5 - 15.5 %   Platelets 205 150 - 400 K/uL   Neutrophils Relative % 60 %   Neutro Abs 4.9 1.7 - 7.7 K/uL   Lymphocytes Relative 31 %   Lymphs Abs 2.5 0.7 - 4.0 K/uL   Monocytes Relative 8 %   Monocytes Absolute 0.6 0.1 - 1.0 K/uL   Eosinophils Relative 1 %   Eosinophils Absolute 0.1 0.0 - 0.7 K/uL   Basophils Relative 0 %   Basophils Absolute 0.0 0.0 - 0.1 K/uL  Protime-INR     Status: None   Collection Time: 04/05/15  4:16 PM  Result Value Ref Range   Prothrombin Time 14.1 11.6 - 15.2 seconds   INR  1.07 0.00 - 1.49  I-stat troponin, ED     Status: None   Collection Time: 04/05/15  4:25 PM  Result Value Ref Range   Troponin i, poc 0.00 0.00 - 0.08 ng/mL   Comment 3            Comment: Due to the release kinetics of cTnI, a negative result within the first hours of the onset of symptoms does not rule out myocardial infarction with certainty. If myocardial infarction is still suspected, repeat the test at appropriate intervals.   I-Stat CG4 Lactic Acid, ED     Status: Abnormal   Collection Time: 04/05/15  4:27 PM  Result Value Ref Range   Lactic Acid, Venous 3.04 (HH) 0.5 - 2.0 mmol/L   Comment NOTIFIED PHYSICIAN   Urine rapid drug screen (hosp performed)     Status: Abnormal   Collection Time: 04/05/15  4:30 PM  Result Value Ref Range   Opiates NONE DETECTED NONE DETECTED   Cocaine NONE DETECTED NONE DETECTED   Benzodiazepines POSITIVE (A) NONE DETECTED   Amphetamines NONE DETECTED NONE DETECTED   Tetrahydrocannabinol NONE DETECTED NONE DETECTED   Barbiturates NONE DETECTED NONE DETECTED    Comment:        DRUG SCREEN FOR MEDICAL PURPOSES ONLY.  IF CONFIRMATION IS NEEDED FOR ANY PURPOSE, NOTIFY LAB WITHIN 5 DAYS.        LOWEST DETECTABLE LIMITS FOR URINE DRUG SCREEN Drug Class       Cutoff (ng/mL) Amphetamine      1000 Barbiturate      200 Benzodiazepine   329 Tricyclics       518 Opiates  300 Cocaine          300 THC              50   I-Stat CG4 Lactic Acid, ED     Status: Abnormal   Collection Time: 04/05/15  7:04 PM  Result Value Ref Range   Lactic Acid, Venous 2.04 (HH) 0.5 - 2.0 mmol/L   Comment NOTIFIED PHYSICIAN     Current Facility-Administered Medications  Medication Dose Route Frequency Provider Last Rate Last Dose  . acetaminophen (TYLENOL) tablet 650 mg  650 mg Oral Q6H PRN Delfin Gant, NP      . escitalopram (LEXAPRO) tablet 20 mg  20 mg Oral Daily Sharlett Iles, MD   20 mg at 04/06/15 1034  . gabapentin (NEURONTIN) capsule  100 mg  100 mg Oral TID PRN Sharlett Iles, MD   100 mg at 04/05/15 2138  . glyBURIDE (DIABETA) tablet 5 mg  5 mg Oral BID WC Sharlett Iles, MD   5 mg at 04/06/15 0838  . irbesartan (AVAPRO) tablet 37.5 mg  37.5 mg Oral Daily Sharlett Iles, MD   37.5 mg at 04/06/15 1034  . metFORMIN (GLUCOPHAGE) tablet 1,000 mg  1,000 mg Oral BID WC Sharlett Iles, MD   1,000 mg at 04/06/15 0838  . pantoprazole (PROTONIX) EC tablet 40 mg  40 mg Oral Daily Sharlett Iles, MD   40 mg at 04/06/15 1034  . QUEtiapine (SEROQUEL) tablet 50 mg  50 mg Oral QHS Sharlett Iles, MD   50 mg at 04/05/15 2138   Current Outpatient Prescriptions  Medication Sig Dispense Refill  . carisoprodol (SOMA) 350 MG tablet Take 350 mg by mouth 4 (four) times daily as needed for muscle spasms.    . clonazePAM (KLONOPIN) 1 MG tablet Take 1 tablet (1 mg total) by mouth daily as needed for anxiety. 10 tablet 0  . escitalopram (LEXAPRO) 20 MG tablet Take 1 tablet (20 mg total) by mouth daily. 30 tablet 0  . gabapentin (NEURONTIN) 100 MG capsule Take 2 capsules (200 mg total) by mouth 2 (two) times daily. (Patient taking differently: Take 100 mg by mouth 3 (three) times daily as needed (for nerve pain). ) 120 capsule 0  . glyBURIDE (DIABETA) 5 MG tablet Take 5 mg by mouth 2 (two) times daily.    . metFORMIN (GLUCOPHAGE) 1000 MG tablet Take 1 tablet (1,000 mg total) by mouth 2 (two) times daily with a meal. 180 tablet 1  . omeprazole (PRILOSEC) 20 MG capsule Take 20 mg by mouth daily.     . QUEtiapine (SEROQUEL) 50 MG tablet Take 0.5 tablets (25 mg total) by mouth at bedtime. (Patient taking differently: Take 50 mg by mouth at bedtime. ) 15 tablet 0  . valsartan (DIOVAN) 160 MG tablet Take 1 tablet (160 mg total) by mouth daily. 90 tablet 1    Musculoskeletal: Strength & Muscle Tone: within normal limits Gait & Station: normal Patient leans: N/A  Psychiatric Specialty Exam: Review of Systems   Constitutional: Negative.   HENT: Negative.   Eyes: Negative.   Respiratory: Negative.   Cardiovascular: Negative.   Gastrointestinal: Negative.   Genitourinary: Negative.   Musculoskeletal: Negative.   Skin: Negative.   Neurological: Negative.   Endo/Heme/Allergies: Negative.        TYPE 2 DM    Blood pressure 130/83, pulse 103, temperature 98.3 F (36.8 C), temperature source Oral, resp. rate 20, SpO2 97 %.There is no  weight on file to calculate BMI.  General Appearance: Casual and Fairly Groomed  Engineer, water::  Good  Speech:  Clear and Coherent and Normal Rate  Volume:  Normal  Mood:  Angry and Depressed  Affect:  Congruent and Depressed  Thought Process:  Coherent, Goal Directed and Intact  Orientation:  Full (Time, Place, and Person)  Thought Content:  WDL  Suicidal Thoughts:  No  Homicidal Thoughts:  No  Memory:  Immediate;   Good Recent;   Good Remote;   Good  Judgement:  Poor  Insight:  Fair  Psychomotor Activity:  Normal  Concentration:  Good  Recall:  NA  Fund of Knowledge:Fair  Language: Good  Akathisia:  NA  Handed:  Right  AIMS (if indicated):     Assets:  Desire for Improvement  ADL's:  Intact  Cognition: WNL  Sleep:      Treatment Plan Summary: Daily contact with patient to assess and evaluate symptoms and progress in treatment and Medication management  Disposition: Resume home medications, monitor for Benzo withdrawal symptoms and treat according.    Delfin Gant   PMHNP-BC 04/06/2015 1:06 PM Patient seen face-to-face for psychiatric evaluation, chart reviewed and case discussed with the physician extender and developed treatment plan. Reviewed the information documented and agree with the treatment plan. Corena Pilgrim, MD

## 2015-04-06 NOTE — ED Notes (Signed)
Pt transported to [facility] by [facility] transportation service for continuation of specialized care. Belongings given to driver after patient signed for them. Pt left in no acute distress. 

## 2015-04-06 NOTE — ED Notes (Signed)
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  He likes to change the way he feels.  We spoke at length about different programs including AA and NA.  He said he knows he has to find something to replace them with.  He has agreed to be honest when he goes to [facility].

## 2015-04-06 NOTE — Progress Notes (Signed)
Patient alert and oriented x 4. Patient denies SI/HI/AVH. Patient rates pain 6/10 with chronic pain in his back. Patient states he takes Soma or tylenol for pain. Patient states "I was not trying to over dose on my medications, I was just taking more than I should so I could not feel. My ex wife told me she was getting remarried." When this writer was checking in medications Patient has a empty bottle of clonazepam and Ambien, that was filled 03/26/15. Patient stated, "If I over dosed I would have just gone to sleep." Patient signed a 72 hour paper work at 2229. Patient was pleasant during assessment. Patient was oriented to unit.

## 2015-04-06 NOTE — ED Notes (Signed)
Patient denies SI, HI and AVH at this time. Plan of care discussed with patient. Patient voices no complaints or concerns. Encouragement and support provided and safety maintain. Q 15 min safety checks remain in place.

## 2015-04-06 NOTE — BH Assessment (Signed)
BHH Assessment Progress Note  Per Thedore Mins, MD, this pt requires psychiatric hospitalization at this time.  Berneice Heinrich, RN, [facility] has assigned pt to [facility] Rm 306-2.  Pt has signed Voluntary Admission and Consent for Treatment, as well as Consent to Release Information to no one.  Signed forms have been faxed to [facility].  Pt's nurse, Kendal Hymen, has been notified, and agrees to send original paperwork along with pt via Juel Burrow, and to call report to [phone].  Doylene Canning, MA Triage Specialist [phone]

## 2015-04-06 NOTE — Tx Team (Signed)
Initial Interdisciplinary Treatment Plan   PATIENT STRESSORS: Health problems Medication change or noncompliance Substance abuse   PATIENT STRENGTHS: Ability for insight Active sense of humor Average or above average intelligence Communication skills Financial means Work skills   PROBLEM LIST: Problem List/Patient Goals Date to be addressed Date deferred Reason deferred Estimated date of resolution  "I have issues taking my medications" 04/06/15     "I am depressed" 04/06/15                                                DISCHARGE CRITERIA:  Motivation to continue treatment in a less acute level of care Reduction of life-threatening or endangering symptoms to within safe limits Withdrawal symptoms are absent or subacute and managed without 24-hour nursing intervention  PRELIMINARY DISCHARGE PLAN: Attend 12-step recovery group Outpatient therapy Return to previous living arrangement  PATIENT/FAMIILY INVOLVEMENT: This treatment plan has been presented to and reviewed with the patient, Jason [name].  The patient and family have been given the opportunity to ask questions and make suggestions.  Wandra Mannan 04/06/2015, 10:41 PM

## 2015-04-07 ENCOUNTER — Encounter (HOSPITAL_COMMUNITY): Payer: Self-pay | Admitting: Psychiatry

## 2015-04-07 DIAGNOSIS — F410 Panic disorder [episodic paroxysmal anxiety] without agoraphobia: Secondary | ICD-10-CM

## 2015-04-07 DIAGNOSIS — F332 Major depressive disorder, recurrent severe without psychotic features: Principal | ICD-10-CM

## 2015-04-07 DIAGNOSIS — F1310 Sedative, hypnotic or anxiolytic abuse, uncomplicated: Secondary | ICD-10-CM

## 2015-04-07 DIAGNOSIS — F411 Generalized anxiety disorder: Secondary | ICD-10-CM

## 2015-04-07 LAB — GLUCOSE, CAPILLARY
GLUCOSE-CAPILLARY: 125 mg/dL — AB (ref 65–99)
GLUCOSE-CAPILLARY: 146 mg/dL — AB (ref 65–99)
GLUCOSE-CAPILLARY: 142 mg/dL — AB (ref 65–99)
Glucose-Capillary: 155 mg/dL — ABNORMAL HIGH (ref 65–99)

## 2015-04-07 MED ORDER — LORAZEPAM 1 MG PO TABS: 1.0000 mg | ORAL_TABLET | Freq: Every day | ORAL | Status: DC

## 2015-04-07 MED ORDER — GABAPENTIN 100 MG PO CAPS
200.0000 mg | ORAL_CAPSULE | Freq: Two times a day (BID) | ORAL | Status: DC
  Administered 2015-04-07 – 2015-04-09 (×4): 200 mg via ORAL
  Filled 2015-04-07 (×6): qty 2

## 2015-04-07 MED ORDER — LORAZEPAM 1 MG PO TABS
1.0000 mg | ORAL_TABLET | Freq: Four times a day (QID) | ORAL | Status: AC
  Administered 2015-04-07 (×3): 1 mg via ORAL
  Filled 2015-04-07: qty 1
  Filled 2015-04-07 (×2): qty 2

## 2015-04-07 MED ORDER — HYDROXYZINE HCL 25 MG PO TABS: 25.0000 mg | ORAL_TABLET | Freq: Four times a day (QID) | ORAL | Status: DC | PRN

## 2015-04-07 MED ORDER — LORAZEPAM 1 MG PO TABS
1.0000 mg | ORAL_TABLET | Freq: Two times a day (BID) | ORAL | Status: DC
  Administered 2015-04-09: 1 mg via ORAL
  Filled 2015-04-07: qty 1

## 2015-04-07 MED ORDER — LORAZEPAM 1 MG PO TABS
1.0000 mg | ORAL_TABLET | Freq: Three times a day (TID) | ORAL | Status: AC
Start: 2015-04-08 — End: 2015-04-08
  Administered 2015-04-08 (×3): 1 mg via ORAL
  Filled 2015-04-07: qty 1
  Filled 2015-04-07 (×2): qty 2

## 2015-04-07 NOTE — Progress Notes (Signed)
D- Patient is flat and anxious with a depressed mood.  Patient is cooperative/pleasant/brightens on approach.  Patient currently denies SI/HI/AVH and pain.  Patient reports the need to leave by Friday for an event he has at work but verbalizes that he understands that he has a problem with taking pills as a coping mechanism and would like help.  Patient's main stressor is his divorce from wife 3 years ago but reports that he had began abusing pills while they were still together.  Patient rates his depression, feelings of hopelessness, and anxiety, all a "'3/10" with 10 being the worst.  Patient's goal for today is to learn how to cope with his depression.    A- Scheduled medications administered to patient, per MD orders. Support and encouragement provided.  Routine safety checks conducted every 15 minutes.  Patient informed to notify staff with problems or concerns. R- No adverse drug reactions noted. Patient contracts for safety at this time. Patient compliant with medications and treatment plan. Patient receptive, calm, and cooperative. Patient interacts well with others on the unit.  Patient remains safe at this time.

## 2015-04-07 NOTE — BHH Suicide Risk Assessment (Signed)
[facility] INPATIENT:  Family/Significant Other Suicide Prevention Education  Suicide Prevention Education:  Patient Refusal for Family/Significant Other Suicide Prevention Education: The patient Jason [name] has refused to provide written consent for family/significant other to be provided Family/Significant Other Suicide Prevention Education during admission and/or prior to discharge.  Physician notified. SPE reviewed with patient and brochure provided. Patient encouraged to return to hospital if having suicidal thoughts, patient verbalized his/her understanding and has no further questions at this time.   [name], West CarboKristin L 04/07/2015, 3:57 PM

## 2015-04-07 NOTE — [facility] Suicide Risk Assessment (Signed)
[facility]   Nursing information obtained from:    Demographic factors:    Current Mental Status:    Loss Factors:    Historical Factors:    Risk Reduction Factors:    Total Time spent with patient: 45 minutes Principal Problem: MDD (major depressive disorder), recurrent episode, severe (HCC) Diagnosis:   Patient Active Problem List   Diagnosis Date Noted  . 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 GAD (generalized anxiety disorder) [F41.1] 04/07/2015  . 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 MDD (major depressive disorder), recurrent episode, severe (HCC) [F33.2] 04/06/2015  . Intentional clonazepam overdose (HCC) [T42.4X2A]   . Depression [F32.9] 03/15/2015  . Type 2 diabetes mellitus, uncontrolled (HCC) [E11.65] 03/15/2015  . Essential hypertension [I10] 03/15/2015  . Insomnia [G47.00] 03/15/2015  . Panic attacks [F41.0] 03/15/2015  . Overdose [T50.901A] 03/08/2015  . Lactic acidosis [E87.2] 03/08/2015  . Hyponatremia [E87.1] 03/08/2015  . Hypomagnesemia [E83.42] 03/08/2015     Continued Clinical Symptoms:  Alcohol Use Disorder Identification Test Final Score (AUDIT): 1 The "Alcohol Use Disorders Identification Test", Guidelines for Use in Primary Care, Second Edition.  World Science writerHealth Organization [facility](WHO). Score between 0-7:  no or low risk or alcohol related problems. Score between 8-15:  moderate risk of alcohol related problems. Score between 16-19:  high risk of alcohol related problems. Score 20 or above:  warrants further diagnostic evaluation for alcohol dependence and treatment.   CLINICAL FACTORS:   Depression:   Comorbid alcohol abuse/dependence Impulsivity Alcohol/Substance Abuse/Dependencies  Psychiatric Specialty Exam: Physical Exam  ROS  Blood pressure 146/98, pulse 51, temperature 98.2 F (36.8 C), temperature source Oral, resp. rate 18, height 5\' 5"  (1.651  m), weight 91.173 kg (201 lb), SpO2 98 %.Body mass index is 33.45 kg/(m^2).    COGNITIVE FEATURES THAT CONTRIBUTE TO RISK:  Closed-mindedness, Polarized thinking and Thought constriction (tunnel vision)    SUICIDE RISK:   Mild:  Suicidal ideation of limited frequency, intensity, duration, and specificity.  There are no identifiable plans, no associated intent, mild dysphoria and related symptoms, good self-control (both objective and subjective assessment), few other risk factors, and identifiable protective factors, including available and accessible social support.  PLAN OF CARE: See Admission H and P  Medical Decision Making:  Review of Psycho-Social Stressors (1), Review or order clinical lab tests (1), Review of Medication Regimen & Side Effects (2) and Review of New Medication or Change in Dosage (2)  I certify that inpatient services furnished can reasonably be expected to improve the patient's condition.   Jorita Bohanon A 04/07/2015, 3:51 PM

## 2015-04-07 NOTE — Progress Notes (Signed)
Recreation Therapy Notes  Date: 10.12.2016 Time: 9:30am Location: [address] Group Room   Group Topic: Stress Management  Goal Area(s) Addresses:  Patient will actively participate in stress management techniques presented during session.   Behavioral Response: Did not attend.  Marykay Lexenise L Izrael Peak, LRT/CTRS        Cyanne Delmar L 04/07/2015 1:03 PM
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
## 2015-04-08 LAB — GLUCOSE, CAPILLARY
GLUCOSE-CAPILLARY: 108 mg/dL — AB (ref 65–99)
GLUCOSE-CAPILLARY: 257 mg/dL — AB (ref 65–99)
GLUCOSE-CAPILLARY: 83 mg/dL (ref 65–99)
Glucose-Capillary: 101 mg/dL — ABNORMAL HIGH (ref 65–99)
Glucose-Capillary: 59 mg/dL — ABNORMAL LOW (ref 65–99)

## 2015-04-08 NOTE — Progress Notes (Signed)
D: Pt has depressed affect and pleasant mood.  Pt reports that he is "discharging tomorrow."  Pt reports he feels safe to discharge tomorrow.  He plans to return to [employer] and follow up with a psychiatrist.  Pt denies SI/HI, denies hallucinations, reports back pain of 7/10.  Pt has been visible in milieu interacting with peers and staff appropriately.  Pt did not attend evening group.   A: Introduced self to pt.  Met with pt 1:1 and provided support and encouragement.  Actively listened to pt.  Hypoglycemic event occurred earlier tonight.  Pt was compliant with treatment interventions.  Medications administered per order.  PRN medication administered for pain. R: Pt is compliant with medications.  Pt verbally contracts for safety.  Will continue to monitor and assess.

## 2015-04-08 NOTE — Progress Notes (Signed)
Hypoglycemic Event  CBG: 59 mg/dL  Treatment: 15 GM carbohydrate snack  Symptoms: Shaky  Follow-up CBG: Time:2115 CBG Result:83 mg/dL  Possible Reasons for Event: Unknown  Comments/MD notified:Ijeoma notified at 2120.  No further orders given.      [id number]Serine Kea, Alverda SkeansBrandon J

## 2015-04-08 NOTE — [facility] Group Notes (Addendum)
[facility] LCSW Group Therapy 04/07/15  1:15 PM Type of Therapy: Group Therapy Participation Level: Active  Participation Quality: Attentive, Sharing and Supportive  Affect: Appropriate  Cognitive: Alert and Oriented  Insight: Developing/Improving and Engaged  Engagement in Therapy: Developing/Improving and Engaged  Modes of Intervention: Clarification, Confrontation, Discussion, Education, Exploration, Limit-setting, Orientation, Problem-solving, Rapport Building, Dance movement psychotherapisteality Testing, Socialization and Support  Summary of Progress/Problems: The topic for group today was emotional regulation. This group focused on both positive and negative emotion identification and allowed group members to process ways to identify feelings, regulate negative emotions, and find healthy ways to manage internal/external emotions. Group members were asked to reflect on a time when their reaction to an emotion led to a negative outcome and explored how alternative responses using emotion regulation would have benefited them. Group members were also asked to discuss a time when emotion regulation was utilized when a negative emotion was experienced. Patient actively participated in discussion of emotional stressors and healthy coping skills. Patient shared examples of ways in which he has dealt with anger in healthy was in the past. He identified his desire for a romantic relationship in his life as a stressor. CSW and other group members provided patient with emotional support and encouragement.  Samuella BruinKristin Abri Vacca, MSW, [employer]CSWA Clinical Social Worker [facility] [phone]

## 2015-04-08 NOTE — [facility] Group Notes (Signed)
[facility] LCSW Group Therapy 04/08/2015  1:15 pm   Type of Therapy: Group Therapy Participation Level: Active  Participation Quality: Attentive, Sharing and Supportive  Affect: Appropriate  Cognitive: Alert and Oriented  Insight: Developing/Improving and Engaged  Engagement in Therapy: Developing/Improving and Engaged  Modes of Intervention: Clarification, Confrontation, Discussion, Education, Exploration, Limit-setting, Orientation, Problem-solving, Rapport Building, Dance movement psychotherapisteality Testing, Socialization and Support  Summary of Progress/Problems: The topic for group was balance in life. Today's group focused on defining balance in one's own words, identifying things that can knock one off balance, and exploring healthy ways to maintain balance in life. Group members were asked to provide an example of a time when they felt off balance, describe how they handled that situation,and process healthier ways to regain balance in the future. Group members were asked to share the most important tool for maintaining balance that [facility] and how they plan to apply this method after discharge. Patient discussed his difficulty taking his prescription pain medications as prescribed and discussed the potentially negative consequences that may happen if he does not get into recovery. He shared that he needs to be more sociable and looks forward to dating again. CSW and other group members provided patient with emotional support and encouragement.  Jason BruinKristin [name], MSW, [employer]CSWA Clinical Social Worker [facility] [phone]

## 2015-04-08 NOTE — Progress Notes (Signed)
D: Patient alert and oriented x 4. Patient denies pain/SI/HI/AVH. Patient was pleasant during assessment. Patient was seen interacting with other patients in day room.  A: Staff to monitor Q 15 mins for safety. Encouragement and support offered. Scheduled medications administered per orders. R: Patient remains safe on the unit. Patient attended group tonight. Patient visible on hte unit and interacting with peers. Patient taking administered medications.

## 2015-04-08 NOTE — [facility] Group Notes (Addendum)
   [facility]  04/07/2015  8:45 AM   Participation Quality: Alert, Appropriate and Oriented  Mood/Affect: Appropriate   Depression Rating: 3  Anxiety Rating: 3  Thoughts of Suicide: Pt denies SI/HI  Will you contract for safety? Yes  Current AVH: Pt denies  Plan for Discharge/Comments: Pt attended discharge planning group and actively participated in group. CSW provided pt with today's workbook. Patient plans to return home at discharge to follow up with outpatient services.  Transportation Means: Pt reports access to transportation  Supports: No supports mentioned at this time  Samuella BruinKristin Jelani Trueba, MSW, [employer]LCSWA Clinical Social Worker [facility] [phone]

## 2015-04-08 NOTE — Tx Team (Signed)
Interdisciplinary Treatment Plan Update (Adult) Date: 04/08/2015   Time Reviewed: 9:30 AM  Progress in Treatment: Attending groups: Yes Participating in groups: Yes Taking medication as prescribed: Yes Tolerating medication: Yes Family/Significant other contact made: No, patient has declined collateral contact  Patient understands diagnosis: Yes Discussing patient identified problems/goals with staff: Yes Medical problems stabilized or resolved: Yes Denies suicidal/homicidal ideation: Yes Issues/concerns per patient self-inventory: Yes Other:  New problem(s) identified: N/A  Discharge Plan or Barriers: Home with outpatient services.     Reason for Continuation of Hospitalization:  Depression Anxiety Medication Stabilization   Comments: N/A  Estimated length of stay: 1-2 days   Patient is a 44 year old Caucasian male admitted for depression and an intentional overdose on his medications, though he denies a suicide attempt. 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 Stressors include: his prescription medication abuse, limited support system, and ex-wife getting remarried. Patient identified several family members and friends as supportive. Patient plans to return to his residence at discharge and follow up with outpatient services. He declines for CSW to make referral as he prefers to make his own appointments. Patient provided with listing of local providers. Patient will benefit from crisis stabilization, medication evaluation, group therapy, and psycho education in addition to case management for discharge planning. Patient and CSW reviewed pt's identified goals and treatment plan. Pt verbalized understanding and agreed to treatment plan.     Review of initial/current patient goals per problem list:  1. Goal(s): Patient will participate in aftercare plan   Met: Yes   Target date: 3-5 days post admission date   As evidenced by: Patient  will participate within aftercare plan AEB aftercare provider and housing plan at discharge being identified.  10/12: Goal met: Patient plans to return home to follow up with outpatient services.       2. Goal (s): Patient will exhibit decreased depressive symptoms and suicidal ideations.   Met: Yes   Target date: 3-5 days post admission date   As evidenced by: Patient will utilize self rating of depression at 3 or below and demonstrate decreased signs of depression or be deemed stable for discharge by MD.  10/12: Goal met: Patient rates depression at 3 today, denies SI.       3. Goal(s): Patient will demonstrate decreased signs and symptoms of anxiety.   Met: Yes   Target date: 3-5 days post admission date   As evidenced by: Patient will utilize self rating of anxiety at 3 or below and demonstrated decreased signs of anxiety, or be deemed stable for discharge by MD  10/12: Goal met: Patient rates anxiety at 3 today.      4. Goal(s): Patient will demonstrate decreased signs of withdrawal due to substance abuse   Met: Yes   Target date: 3-5 days post admission date   As evidenced by: Patient will produce a CIWA/COWS score of 0, have stable vitals signs, and no symptoms of withdrawal  10/13: Goal met: No withdrawal symptoms reported at this time per medical chart.    Attendees: Patient:    Family:    Physician: Dr. Parke Poisson; Dr. Sabra Heck 04/08/2015 9:30 AM  Nursing: Festus Aloe, Loletta Specter, Hedy Jacob, RN 04/08/2015 9:30 AM  Clinical Social Worker: Tilden Fossa,  [place] 04/08/2015 9:30 AM  Other: Maxie Better, Peri Maris LCSWA  04/08/2015 9:30 AM  Other: Lucinda Dell, Beverly Sessions Liaison 04/08/2015 9:30 AM  Other: Lars Pinks, CM 04/08/2015 9:30 AM  Other:   04/08/2015 9:30 AM  Other:      Scribe for Treatment Team:  Tilden Fossa, MSW, [place]

## 2015-04-08 NOTE — Progress Notes (Signed)
[facility]  04/08/2015 4:08 PM Jason BoroughsGordon Knoke  MRN:  [id number] Subjective:  Jason [name] states he is trying to move forward. He has accepted what he should have accepted long time ago that the relationship with his ex wife is over. States he does not understand how he reacted the way he did when he found out she has gotten married as states he does not love her anymore. He is working on ways to handle stress that is not by "popping pills" he states he experiences panic attacks that would probably need him to take the medication but they do not happen that often. He was getting to depend on the Klonopin to handle minor stuff he could have handled some other ways. He plans to get involved in counseling  Principal Problem: MDD (major depressive disorder), recurrent episode, severe (HCC) Diagnosis:   Patient Active Problem List   Diagnosis Date Noted  . 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 GAD (generalized anxiety disorder) [F41.1] 04/07/2015  . 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 MDD (major depressive disorder), recurrent episode, severe (HCC) [F33.2] 04/06/2015  . Intentional clonazepam overdose (HCC) [T42.4X2A]   . Depression [F32.9] 03/15/2015  . Type 2 diabetes mellitus, uncontrolled (HCC) [E11.65] 03/15/2015  . Essential hypertension [I10] 03/15/2015  . Insomnia [G47.00] 03/15/2015  . Panic attacks [F41.0] 03/15/2015  . Overdose [T50.901A] 03/08/2015  . Lactic acidosis [E87.2] 03/08/2015  . Hyponatremia [E87.1] 03/08/2015  . Hypomagnesemia [E83.42] 03/08/2015   Total Time spent with patient: 30 minutes  Past Psychiatric History: see admission H and P  Past Medical History:  Past Medical History  Diagnosis Date  . Back pain   . Depression   . Chicken pox   . Diabetes mellitus without complication (HCC)   . Hypertension   . Hyperlipidemia   . Kidney stones   . Ulceration of colon     Past Surgical  History  Procedure Laterality Date  . Back surgery    . Hernia repair    . Fracture surgery      ankle, arm,    Family History:  Family History  Problem Relation Age of Onset  . Congestive Heart Failure Father   . Hyperlipidemia Father   . Heart disease Father   . Stroke Father   . Hypertension Father   . Diabetes Father   . Hyperlipidemia Mother   . Heart disease Mother   . Hypertension Mother   . Diabetes Mother    Family Psychiatric  History: see admission H and P Social History:  History  Alcohol Use  . Yes    Comment: Rarely     History  Drug Use No    Social History   Social History  . Marital Status: Single    Spouse Name: N/A  . Number of Children: 0  . Years of Education: 18   Occupational History  . [employer]    Social History Main Topics  . Smoking status: Never Smoker   . Smokeless tobacco: Never Used  . Alcohol Use: Yes     Comment: Rarely  . Drug Use: No  . Sexual Activity: Not Currently    Birth Control/ Protection: Condom   Other Topics Concern  . None   Social History Narrative   Fun: Gaming of all types, movies   Denies religious beliefs effecting health care.    Additional Social History:    History of alcohol / drug use?: No history  of alcohol / drug abuse                    Sleep: Fair  Appetite:  Fair  Current Medications: Current Facility-Administered Medications  Medication Dose Route Frequency Provider Last Rate Last Dose  . acetaminophen (TYLENOL) tablet 650 mg  650 mg Oral Q6H PRN Kerry Hough, PA-C      . alum & mag hydroxide-simeth (MAALOX/MYLANTA) 200-200-20 MG/5ML suspension 30 mL  30 mL Oral Q4H PRN Kerry Hough, PA-C      . carisoprodol (SOMA) tablet 350 mg  350 mg Oral TID Kerry Hough, PA-C   350 mg at 04/08/15 1217  . escitalopram (LEXAPRO) tablet 20 mg  20 mg Oral Daily Kerry Hough, PA-C   20 mg at 04/08/15 0820  . gabapentin (NEURONTIN) capsule 200 mg  200 mg Oral BID Rachael Fee, MD   200 mg at 04/08/15 0820  . glyBURIDE (DIABETA) tablet 5 mg  5 mg Oral BID Kerry Hough, PA-C   5 mg at 04/08/15 1610  . hydrOXYzine (ATARAX/VISTARIL) tablet 25 mg  25 mg Oral Q6H PRN Rachael Fee, MD      . insulin aspart (novoLOG) injection 0-20 Units  0-20 Units Subcutaneous TID WC Kerry Hough, PA-C   3 Units at 04/07/15 1730  . irbesartan (AVAPRO) tablet 150 mg  150 mg Oral Daily Kerry Hough, PA-C   150 mg at 04/08/15 9604  . LORazepam (ATIVAN) tablet 1 mg  1 mg Oral TID Rachael Fee, MD   1 mg at 04/08/15 1148   Followed by  . [START ON 04/09/2015] LORazepam (ATIVAN) tablet 1 mg  1 mg Oral BID Rachael Fee, MD       Followed by  . [START ON 04/10/2015] LORazepam (ATIVAN) tablet 1 mg  1 mg Oral Daily Rachael Fee, MD      . magnesium hydroxide (MILK OF MAGNESIA) suspension 30 mL  30 mL Oral Daily PRN Kerry Hough, PA-C      . metFORMIN (GLUCOPHAGE) tablet 1,000 mg  1,000 mg Oral BID WC Kerry Hough, PA-C   1,000 mg at 04/08/15 5409  . pantoprazole (PROTONIX) EC tablet 40 mg  40 mg Oral Daily Kerry Hough, PA-C   40 mg at 04/08/15 0820  . QUEtiapine (SEROQUEL) tablet 50 mg  50 mg Oral QHS Kerry Hough, PA-C   50 mg at 04/07/15 2156    Lab Results:  Results for orders placed or performed during the hospital encounter of 04/06/15 (from the past 48 hour(s))  Glucose, capillary     Status: Abnormal   Collection Time: 04/06/15 10:39 PM  Result Value Ref Range   Glucose-Capillary 157 (H) 65 - 99 mg/dL   Comment 1 Notify RN   Glucose, capillary     Status: Abnormal   Collection Time: 04/07/15  6:09 AM  Result Value Ref Range   Glucose-Capillary 125 (H) 65 - 99 mg/dL   Comment 1 Notify RN   Glucose, capillary     Status: Abnormal   Collection Time: 04/07/15 12:14 PM  Result Value Ref Range   Glucose-Capillary 146 (H) 65 - 99 mg/dL   Comment 1 Notify RN   Glucose, capillary     Status: Abnormal   Collection Time: 04/07/15  5:15 PM  Result Value Ref  Range   Glucose-Capillary 142 (H) 65 - 99 mg/dL  Glucose, capillary  Status: Abnormal   Collection Time: 04/07/15  9:07 PM  Result Value Ref Range   Glucose-Capillary 155 (H) 65 - 99 mg/dL   Comment 1 Notify RN    Comment 2 Document in Chart   Glucose, capillary     Status: Abnormal   Collection Time: 04/08/15  6:03 AM  Result Value Ref Range   Glucose-Capillary 108 (H) 65 - 99 mg/dL   Comment 1 Notify RN   Glucose, capillary     Status: Abnormal   Collection Time: 04/08/15 11:53 AM  Result Value Ref Range   Glucose-Capillary 101 (H) 65 - 99 mg/dL    Physical Findings: AIMS: Facial and Oral Movements Muscles of Facial Expression: None, normal Lips and Perioral Area: None, normal Jaw: None, normal Tongue: None, normal,Extremity Movements Upper (arms, wrists, hands, fingers): None, normal Lower (legs, knees, ankles, toes): None, normal, Trunk Movements Neck, shoulders, hips: None, normal, Overall Severity Severity of abnormal movements (highest score from questions above): None, normal Incapacitation due to abnormal movements: None, normal Patient's awareness of abnormal movements (rate only patient's report): No Awareness, Dental Status Current problems with teeth and/or dentures?: No Does patient usually wear dentures?: No  CIWA:  CIWA-Ar Total: 2 COWS:     Musculoskeletal: Strength & Muscle Tone: within normal limits Gait & Station: normal Patient leans: normal  Psychiatric Specialty Exam: Review of Systems  Constitutional: Negative.   HENT: Negative.   Eyes: Negative.   Respiratory: Negative.   Cardiovascular: Negative.   Gastrointestinal: Negative.   Genitourinary: Negative.   Musculoskeletal: Negative.   Skin: Negative.   Neurological: Negative.   Endo/Heme/Allergies: Negative.   Psychiatric/Behavioral: Positive for depression. The patient is nervous/anxious.     Blood pressure 143/86, pulse 105, temperature 98 F (36.7 C), temperature source Oral,  resp. rate 18, height  (1.651 m), weight 91.173 kg (201 lb), SpO2 98 %.Body mass index is 33.45 kg/(m^2).  General Appearance: Fairly Groomed  Patent attorney::  Fair  Speech:  Clear and Coherent  Volume:  Normal  Mood:  Anxious  Affect:  Restricted  Thought Process:  Coherent and Goal Directed  Orientation:  Full (Time, Place, and Person)  Thought Content:  symptoms events worries concerns  Suicidal Thoughts:  No  Homicidal Thoughts:  No  Memory:  Immediate;   Fair Recent;   Fair Remote;   Fair  Judgement:  Fair  Insight:  Present  Psychomotor Activity:  Normal  Concentration:  Fair  Recall:  [employer] of Knowledge:Fair  Language: Fair  Akathisia:  No  Handed:  Right  AIMS (if indicated):     Assets:  Desire for Improvement Housing Social Support Vocational/Educational  ADL's:  Intact  Cognition: WNL  Sleep:  Number of Hours: 6.25   Treatment Plan Summary: Daily contact with patient to assess and evaluate symptoms and progress in treatment and Medication management Supportive approach/coping skills Depression; continue to work with the Lexapro 20 mg daily Benzodiazepine dependence; continue the Ativan detox protocol Anxiety; work on stress management strategies Mood instability; continue the Seroquel 50 mg HS Pain; continue the Neurontin/Soma Use CBT/mindfulness Takeo Harts A 04/08/2015, 4:08 PM

## 2015-04-08 NOTE — Progress Notes (Signed)
D:Per patient self inventory form pt reports he slept good last night with the use of sleep medication. He reprots a good appetite, normal energy level, good concentration. He rates depression 3/10, hopelessness 3/10, anxiety 3/10- all on 0-10 scale, 10 being the worse. Pt denies SI/HI. Denies AVH. Pt c/o chronic back pain 7/10. Pt reports his goal for the day is "set up to leave" and reports he will "listen to all lessons" to help meet his goal today. Pt attending nursing group on the unit. Insight in regards to behavior. "I have a problem taking to many pills that I am prescribed." Pt behavior calm and cooperative. Pleasant on approach.    A:Special checks q 15 mins in place for safety. Medication administered per MD order(see eMAR) Encouragement and support provided.  R:safety maintained. Compliant with medication regimen. Will continue to monitor.

## 2015-04-08 NOTE — [facility] Group Notes (Signed)
[facility] Group Notes:  (Nursing/MHT/Case Management/Adjunct)  Date:  04/08/2015  Time: 0915  Type of Therapy:  Nurse Education  Participation Level:  Active  Participation Quality:  Appropriate and Attentive  Affect:  Appropriate  Cognitive:  Alert and Appropriate  Insight:  Appropriate and Good  Engagement in Group:  Engaged and Supportive  Modes of Intervention:  Activity, Discussion, Education, Orientation, Socialization and Support  Summary of Progress/Problems:  Dara Hoyershley N Fiora Weill 04/08/2015, 10:22 AM

## 2015-04-09 DIAGNOSIS — F332 Major depressive disorder, recurrent severe without psychotic features: Secondary | ICD-10-CM | POA: Diagnosis present

## 2015-04-09 LAB — GLUCOSE, CAPILLARY: Glucose-Capillary: 142 mg/dL — ABNORMAL HIGH (ref 65–99)

## 2015-04-09 MED ORDER — METFORMIN HCL 1000 MG PO TABS: 1000.0000 mg | ORAL_TABLET | Freq: Two times a day (BID) | ORAL | Status: DC

## 2015-04-09 MED ORDER — VALSARTAN 160 MG PO TABS: 160.0000 mg | ORAL_TABLET | Freq: Every day | ORAL | Status: DC

## 2015-04-09 MED ORDER — OMEPRAZOLE 20 MG PO CPDR: 20.0000 mg | DELAYED_RELEASE_CAPSULE | Freq: Every day | ORAL | Status: DC

## 2015-04-09 MED ORDER — ESCITALOPRAM OXALATE 20 MG PO TABS: 20.0000 mg | ORAL_TABLET | Freq: Every day | ORAL | Status: DC

## 2015-04-09 MED ORDER — INSULIN ASPART 100 UNIT/ML ~~LOC~~ SOLN: 0.0000 [IU] | Freq: Three times a day (TID) | SUBCUTANEOUS | Status: DC

## 2015-04-09 MED ORDER — CARISOPRODOL 350 MG PO TABS: 350.0000 mg | ORAL_TABLET | Freq: Three times a day (TID) | ORAL | Status: DC

## 2015-04-09 MED ORDER — GLYBURIDE 5 MG PO TABS: 5.0000 mg | ORAL_TABLET | Freq: Two times a day (BID) | ORAL | Status: DC

## 2015-04-09 MED ORDER — GABAPENTIN 100 MG PO CAPS: 200.0000 mg | ORAL_CAPSULE | Freq: Two times a day (BID) | ORAL | Status: DC

## 2015-04-09 MED ORDER — QUETIAPINE FUMARATE 50 MG PO TABS: 50.0000 mg | ORAL_TABLET | Freq: Every day | ORAL | Status: DC
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
## 2015-04-09 NOTE — Plan of Care (Signed)
Problem: Alteration in mood & ability to function due to Goal: LTG-Pt reports reduction in suicidal thoughts (Patient reports reduction in suicidal thoughts and is able to verbalize a safety plan for whenever patient is feeling suicidal)  Outcome: Progressing Pt denies SI tonight.  He verbally contracts for safety.       

## 2015-04-09 NOTE — Progress Notes (Signed)
Patient did not attend the evening karaoke group. Pt was notified that group was beginning but remained in his room.

## 2015-04-09 NOTE — Progress Notes (Signed)
  [facility] Discharge Plan :  Will you be returning to the same living situation after discharge:  Yes,  HOME At discharge, do you have transportation home?: Yes, bus pass provided to pt.  Do you have the ability to pay for your medications: Yes,  private insurance  Release of information consent forms completed and submitted to medical records/no consent forms for this pt.   Patient to Follow up at: Follow-up Information    Follow up with Patient declines referral for outpatient services and was provided a listing of local providers. .      Patient denies SI/HI: Yes,  during group/self report.     Safety Planning and Suicide Prevention discussed: Yes,  SPE completed with pt, as he refused to consent to family contact. SPI pamphlet and mobile crisis information provided to pt and he was encouraged to share this with his support network,   Have you used any form of tobacco in the last 30 days? (Cigarettes, Smokeless Tobacco, Cigars, and/or Pipes): No  Has patient been referred to the Quitline?: N/A patient is not a smoker  Smart, Staint ClairHeather LCSWA  04/09/2015, 10:51 AM

## 2015-04-09 NOTE — Progress Notes (Signed)
Discharge note:  Patient discharged home per MD order.  Patient received all personal belongings.  Reviewed discharge instructions and medication administration with patient.  He received prescriptions.  Patient refused any follow up appointments.  He denies SI/HI/AVH.  Patient has a bus ticket and left ambulatory without incident.

## 2015-05-12 ENCOUNTER — Telehealth: Payer: Self-pay | Admitting: Family

## 2015-05-12 NOTE — Telephone Encounter (Signed)
Patient called requesting rx for norco 5-325 and soma. He would also like referral to pain management. He is working on obtaining his prior pain records. I will email him a release form and he will sign it and bring it in.  CB# [phone]  [facility] [place].

## 2015-05-12 NOTE — Telephone Encounter (Signed)
Called pt to let him know that he needs an appointment for any pain meds. Made an appointment for Friday 11/18

## 2015-05-14 ENCOUNTER — Encounter: Payer: Self-pay | Admitting: Family

## 2015-05-14 ENCOUNTER — Ambulatory Visit (INDEPENDENT_AMBULATORY_CARE_PROVIDER_SITE_OTHER): Payer: BC Managed Care – PPO | Admitting: Family

## 2015-05-14 VITALS — BP 168/112 | HR 101 | Temp 98.4°F | Resp 18 | Ht 67.0 in | Wt 202.0 lb

## 2015-05-14 DIAGNOSIS — M25529 Pain in unspecified elbow: Secondary | ICD-10-CM

## 2015-05-14 DIAGNOSIS — M25572 Pain in left ankle and joints of left foot: Secondary | ICD-10-CM

## 2015-05-14 DIAGNOSIS — M25522 Pain in left elbow: Secondary | ICD-10-CM

## 2015-05-14 DIAGNOSIS — M549 Dorsalgia, unspecified: Secondary | ICD-10-CM | POA: Diagnosis not present

## 2015-05-14 DIAGNOSIS — IMO0001 Reserved for inherently not codable concepts without codable children: Secondary | ICD-10-CM

## 2015-05-14 DIAGNOSIS — E1165 Type 2 diabetes mellitus with hyperglycemia: Secondary | ICD-10-CM | POA: Diagnosis not present

## 2015-05-14 DIAGNOSIS — G8929 Other chronic pain: Secondary | ICD-10-CM | POA: Insufficient documentation

## 2015-05-14 DIAGNOSIS — F411 Generalized anxiety disorder: Secondary | ICD-10-CM

## 2015-05-14 MED ORDER — QUETIAPINE FUMARATE 50 MG PO TABS: 50.0000 mg | ORAL_TABLET | Freq: Every day | ORAL | Status: DC

## 2015-05-14 MED ORDER — GLYBURIDE 5 MG PO TABS: 5.0000 mg | ORAL_TABLET | Freq: Two times a day (BID) | ORAL | Status: DC

## 2015-05-14 MED ORDER — HYDROCODONE-ACETAMINOPHEN 5-325 MG PO TABS: 1.0000 | ORAL_TABLET | Freq: Three times a day (TID) | ORAL | Status: DC | PRN

## 2015-05-14 MED ORDER — CLONAZEPAM 1 MG PO TABS: 1.0000 mg | ORAL_TABLET | ORAL | Status: DC | PRN

## 2015-05-14 NOTE — Assessment & Plan Note (Signed)
Ankle exam with good strength and no ligamentous laxity. Continues to experience chronic pain with minimal mobility intact. Continue home therapy exercises and follow up for worsening symptoms.

## 2015-05-14 NOTE — Patient Instructions (Addendum)
Thank you for choosing [facility].  Summary/Instructions:  Your prescription(s) have been submitted to your pharmacy or been printed and provided for you. Please take as directed and contact our office if you believe you are having problem(s) with the medication(s) or have any questions.  Please stop by the lab on the basement level of the building for your blood work. Your results will be released to MyChart (or called to you) after review, usually within 72 hours after test completion. If any changes need to be made, you will be notified at that same time.  If your symptoms worsen or fail to improve, please contact our office for further instruction, or in case of emergency go directly to the emergency room at the closest medical facility.   Heat 2-3 times per day or more as needed. Exercises and stretching as discussed.   Low Back Strain With Rehab A strain is an injury in which a tendon or muscle is torn. The muscles and tendons of the lower back are vulnerable to strains. However, these muscles and tendons are very strong and require a great force to be injured. Strains are classified into three categories. Grade 1 strains cause pain, but the tendon is not lengthened. Grade 2 strains include a lengthened ligament, due to the ligament being stretched or partially ruptured. With grade 2 strains there is still function, although the function may be decreased. Grade 3 strains involve a complete tear of the tendon or muscle, and function is usually impaired. SYMPTOMS   Pain in the lower back.  Pain that affects one side more than the other.  Pain that gets worse with movement and may be felt in the hip, buttocks, or back of the thigh.  Muscle spasms of the muscles in the back.  Swelling along the muscles of the back.  Loss of strength of the back muscles.  Crackling sound (crepitation) when the muscles are touched. CAUSES  Lower back strains occur when a force is placed on the  muscles or tendons that is greater than they can handle. Common causes of injury include:  Prolonged overuse of the muscle-tendon units in the lower back, usually from incorrect posture.  A single violent injury or force applied to the back. RISK INCREASES WITH:  Sports that involve twisting forces on the spine or a lot of bending at the waist (football, rugby, weightlifting, bowling, golf, tennis, speed skating, racquetball, swimming, running, gymnastics, diving).  Poor strength and flexibility.  Failure to warm up properly before activity.  Family history of lower back pain or disk disorders.  Previous back injury or surgery (especially fusion).  Poor posture with lifting, especially heavy objects.  Prolonged sitting, especially with poor posture. PREVENTION   Learn and use proper posture when sitting or lifting (maintain proper posture when sitting, lift using the knees and legs, not at the waist).  Warm up and stretch properly before activity.  Allow for adequate recovery between workouts.  Maintain physical fitness:  Strength, flexibility, and endurance.  Cardiovascular fitness. PROGNOSIS  If treated properly, lower back strains usually heal within 6 weeks. RELATED COMPLICATIONS   Recurring symptoms, resulting in a chronic problem.  Chronic inflammation, scarring, and partial muscle-tendon tear.  Delayed healing or resolution of symptoms.  Prolonged disability. TREATMENT  Treatment first involves the use of ice and medicine, to reduce pain and inflammation. The use of strengthening and stretching exercises may help reduce pain with activity. These exercises may be performed at home or with a therapist.  Severe injuries may require referral to a therapist for further evaluation and treatment, such as ultrasound. Your caregiver may advise that you wear a back brace or corset, to help reduce pain and discomfort. Often, prolonged bed rest results in greater harm then  benefit. Corticosteroid injections may be recommended. However, these should be reserved for the most serious cases. It is important to avoid using your back when lifting objects. At night, sleep on your back on a firm mattress with a pillow placed under your knees. If non-surgical treatment is unsuccessful, surgery may be needed.  MEDICATION   If pain medicine is needed, nonsteroidal anti-inflammatory medicines (aspirin and ibuprofen), or other minor pain relievers (acetaminophen), are often advised.  Do not take pain medicine for 7 days before surgery.  Prescription pain relievers may be given, if your caregiver thinks they are needed. Use only as directed and only as much as you need.  Ointments applied to the skin may be helpful.  Corticosteroid injections may be given by your caregiver. These injections should be reserved for the most serious cases, because they may only be given a certain number of times. HEAT AND COLD  Cold treatment (icing) should be applied for 10 to 15 minutes every 2 to 3 hours for inflammation and pain, and immediately after activity that aggravates your symptoms. Use ice packs or an ice massage.  Heat treatment may be used before performing stretching and strengthening activities prescribed by your caregiver, physical therapist, or athletic trainer. Use a heat pack or a warm water soak. SEEK MEDICAL CARE IF:   Symptoms get worse or do not improve in 2 to 4 weeks, despite treatment.  You develop numbness, weakness, or loss of bowel or bladder function.  New, unexplained symptoms develop. (Drugs used in treatment may produce side effects.) EXERCISES  RANGE OF MOTION (ROM) AND STRETCHING EXERCISES - Low Back Strain Most people with lower back pain will find that their symptoms get worse with excessive bending forward (flexion) or arching at the lower back (extension). The exercises which will help resolve your symptoms will focus on the opposite motion.  Your  physician, physical therapist or athletic trainer will help you determine which exercises will be most helpful to resolve your lower back pain. Do not complete any exercises without first consulting with your caregiver. Discontinue any exercises which make your symptoms worse until you speak to your caregiver.  If you have pain, numbness or tingling which travels down into your buttocks, leg or foot, the goal of the therapy is for these symptoms to move closer to your back and eventually resolve. Sometimes, these leg symptoms will get better, but your lower back pain may worsen. This is typically an indication of progress in your rehabilitation. Be very alert to any changes in your symptoms and the activities in which you participated in the 24 hours prior to the change. Sharing this information with your caregiver will allow him/her to most efficiently treat your condition.  These exercises may help you when beginning to rehabilitate your injury. Your symptoms may resolve with or without further involvement from your physician, physical therapist or athletic trainer. While completing these exercises, remember:  Restoring tissue flexibility helps normal motion to return to the joints. This allows healthier, less painful movement and activity.  An effective stretch should be held for at least 30 seconds.  A stretch should never be painful. You should only feel a gentle lengthening or release in the stretched tissue. FLEXION RANGE OF MOTION  AND STRETCHING EXERCISES: STRETCH - Flexion, Single Knee to Chest   Lie on a firm bed or floor with both legs extended in front of you.  Keeping one leg in contact with the floor, bring your opposite knee to your chest. Hold your leg in place by either grabbing behind your thigh or at your knee.  Pull until you feel a gentle stretch in your lower back. Hold __________ seconds.  Slowly release your grasp and repeat the exercise with the opposite side. Repeat  __________ times. Complete this exercise __________ times per day.  STRETCH - Flexion, Double Knee to Chest   Lie on a firm bed or floor with both legs extended in front of you.  Keeping one leg in contact with the floor, bring your opposite knee to your chest.  Tense your stomach muscles to support your back and then lift your other knee to your chest. Hold your legs in place by either grabbing behind your thighs or at your knees.  Pull both knees toward your chest until you feel a gentle stretch in your lower back. Hold __________ seconds.  Tense your stomach muscles and slowly return one leg at a time to the floor. Repeat __________ times. Complete this exercise __________ times per day.  STRETCH - Low Trunk Rotation  Lie on a firm bed or floor. Keeping your legs in front of you, bend your knees so they are both pointed toward the ceiling and your feet are flat on the floor.  Extend your arms out to the side. This will stabilize your upper body by keeping your shoulders in contact with the floor.  Gently and slowly drop both knees together to one side until you feel a gentle stretch in your lower back. Hold for __________ seconds.  Tense your stomach muscles to support your lower back as you bring your knees back to the starting position. Repeat the exercise to the other side. Repeat __________ times. Complete this exercise __________ times per day  EXTENSION RANGE OF MOTION AND FLEXIBILITY EXERCISES: STRETCH - Extension, Prone on Elbows   Lie on your stomach on the floor, a bed will be too soft. Place your palms about shoulder width apart and at the height of your head.  Place your elbows under your shoulders. If this is too painful, stack pillows under your chest.  Allow your body to relax so that your hips drop lower and make contact more completely with the floor.  Hold this position for __________ seconds.  Slowly return to lying flat on the floor. Repeat __________ times.  Complete this exercise __________ times per day.  RANGE OF MOTION - Extension, Prone Press Ups  Lie on your stomach on the floor, a bed will be too soft. Place your palms about shoulder width apart and at the height of your head.  Keeping your back as relaxed as possible, slowly straighten your elbows while keeping your hips on the floor. You may adjust the placement of your hands to maximize your comfort. As you gain motion, your hands will come more underneath your shoulders.  Hold this position __________ seconds.  Slowly return to lying flat on the floor. Repeat __________ times. Complete this exercise __________ times per day.  RANGE OF MOTION- Quadruped, Neutral Spine   Assume a hands and knees position on a firm surface. Keep your hands under your shoulders and your knees under your hips. You may place padding under your knees for comfort.  Drop your head and point  your tail bone toward the ground below you. This will round out your lower back like an angry cat. Hold this position for __________ seconds.  Slowly lift your head and release your tail bone so that your back sags into a large arch, like an old horse.  Hold this position for __________ seconds.  Repeat this until you feel limber in your lower back.  Now, find your "sweet spot." This will be the most comfortable position somewhere between the two previous positions. This is your neutral spine. Once you have found this position, tense your stomach muscles to support your lower back.  Hold this position for __________ seconds. Repeat __________ times. Complete this exercise __________ times per day.  STRENGTHENING EXERCISES - Low Back Strain These exercises may help you when beginning to rehabilitate your injury. These exercises should be done near your "sweet spot." This is the neutral, low-back arch, somewhere between fully rounded and fully arched, that is your least painful position. When performed in this safe range  of motion, these exercises can be used for people who have either a flexion or extension based injury. These exercises may resolve your symptoms with or without further involvement from your physician, physical therapist or athletic trainer. While completing these exercises, remember:   Muscles can gain both the endurance and the strength needed for everyday activities through controlled exercises.  Complete these exercises as instructed by your physician, physical therapist or athletic trainer. Increase the resistance and repetitions only as guided.  You may experience muscle soreness or fatigue, but the pain or discomfort you are trying to eliminate should never worsen during these exercises. If this pain does worsen, stop and make certain you are following the directions exactly. If the pain is still present after adjustments, discontinue the exercise until you can discuss the trouble with your caregiver. STRENGTHENING - Deep Abdominals, Pelvic Tilt  Lie on a firm bed or floor. Keeping your legs in front of you, bend your knees so they are both pointed toward the ceiling and your feet are flat on the floor.  Tense your lower abdominal muscles to press your lower back into the floor. This motion will rotate your pelvis so that your tail bone is scooping upwards rather than pointing at your feet or into the floor.  With a gentle tension and even breathing, hold this position for __________ seconds. Repeat __________ times. Complete this exercise __________ times per day.  STRENGTHENING - Abdominals, Crunches   Lie on a firm bed or floor. Keeping your legs in front of you, bend your knees so they are both pointed toward the ceiling and your feet are flat on the floor. Cross your arms over your chest.  Slightly tip your chin down without bending your neck.  Tense your abdominals and slowly lift your trunk high enough to just clear your shoulder blades. Lifting higher can put excessive stress on  the lower back and does not further strengthen your abdominal muscles.  Control your return to the starting position. Repeat __________ times. Complete this exercise __________ times per day.  STRENGTHENING - Quadruped, Opposite UE/LE Lift   Assume a hands and knees position on a firm surface. Keep your hands under your shoulders and your knees under your hips. You may place padding under your knees for comfort.  Find your neutral spine and gently tense your abdominal muscles so that you can maintain this position. Your shoulders and hips should form a rectangle that is parallel with the floor  and is not twisted.  Keeping your trunk steady, lift your right hand no higher than your shoulder and then your left leg no higher than your hip. Make sure you are not holding your breath. Hold this position __________ seconds.  Continuing to keep your abdominal muscles tense and your back steady, slowly return to your starting position. Repeat with the opposite arm and leg. Repeat __________ times. Complete this exercise __________ times per day.  STRENGTHENING - Lower Abdominals, Double Knee Lift  Lie on a firm bed or floor. Keeping your legs in front of you, bend your knees so they are both pointed toward the ceiling and your feet are flat on the floor.  Tense your abdominal muscles to brace your lower back and slowly lift both of your knees until they come over your hips. Be certain not to hold your breath.  Hold __________ seconds. Using your abdominal muscles, return to the starting position in a slow and controlled manner. Repeat __________ times. Complete this exercise __________ times per day.  POSTURE AND BODY MECHANICS CONSIDERATIONS - Low Back Strain Keeping correct posture when sitting, standing or completing your activities will reduce the stress put on different body tissues, allowing injured tissues a chance to heal and limiting painful experiences. The following are general guidelines  for improved posture. Your physician or physical therapist will provide you with any instructions specific to your needs. While reading these guidelines, remember:  The exercises prescribed by your provider will help you have the flexibility and strength to maintain correct postures.  The correct posture provides the best environment for your joints to work. All of your joints have less wear and tear when properly supported by a spine with good posture. This means you will experience a healthier, less painful body.  Correct posture must be practiced with all of your activities, especially prolonged sitting and standing. Correct posture is as important when doing repetitive low-stress activities (typing) as it is when doing a single heavy-load activity (lifting). RESTING POSITIONS Consider which positions are most painful for you when choosing a resting position. If you have pain with flexion-based activities (sitting, bending, stooping, squatting), choose a position that allows you to rest in a less flexed posture. You would want to avoid curling into a fetal position on your side. If your pain worsens with extension-based activities (prolonged standing, working overhead), avoid resting in an extended position such as sleeping on your stomach. Most people will find more comfort when they rest with their spine in a more neutral position, neither too rounded nor too arched. Lying on a non-sagging bed on your side with a pillow between your knees, or on your back with a pillow under your knees will often provide some relief. Keep in mind, being in any one position for a prolonged period of time, no matter how correct your posture, can still lead to stiffness. PROPER SITTING POSTURE In order to minimize stress and discomfort on your spine, you must sit with correct posture. Sitting with good posture should be effortless for a healthy body. Returning to good posture is a gradual process. Many people can work  toward this most comfortably by using various supports until they have the flexibility and strength to maintain this posture on their own. When sitting with proper posture, your ears will fall over your shoulders and your shoulders will fall over your hips. You should use the back of the chair to support your upper back. Your lower back will be in a  neutral position, just slightly arched. You may place a small pillow or folded towel at the base of your lower back for support.  When working at a desk, create an environment that supports good, upright posture. Without extra support, muscles tire, which leads to excessive strain on joints and other tissues. Keep these recommendations in mind: CHAIR:  A chair should be able to slide under your desk when your back makes contact with the back of the chair. This allows you to work closely.  The chair's height should allow your eyes to be level with the upper part of your monitor and your hands to be slightly lower than your elbows. BODY POSITION  Your feet should make contact with the floor. If this is not possible, use a foot rest.  Keep your ears over your shoulders. This will reduce stress on your neck and lower back. INCORRECT SITTING POSTURES  If you are feeling tired and unable to assume a healthy sitting posture, do not slouch or slump. This puts excessive strain on your back tissues, causing more damage and pain. Healthier options include:  Using more support, like a lumbar pillow.  Switching tasks to something that requires you to be upright or walking.  Talking a brief walk.  Lying down to rest in a neutral-spine position. PROLONGED STANDING WHILE SLIGHTLY LEANING FORWARD  When completing a task that requires you to lean forward while standing in one place for a long time, place either foot up on a stationary 2-4 inch high object to help maintain the best posture. When both feet are on the ground, the lower back tends to lose its slight  inward curve. If this curve flattens (or becomes too large), then the back and your other joints will experience too much stress, tire more quickly, and can cause pain. CORRECT STANDING POSTURES Proper standing posture should be assumed with all daily activities, even if they only take a few moments, like when brushing your teeth. As in sitting, your ears should fall over your shoulders and your shoulders should fall over your hips. You should keep a slight tension in your abdominal muscles to brace your spine. Your tailbone should point down to the ground, not behind your body, resulting in an over-extended swayback posture.  INCORRECT STANDING POSTURES  Common incorrect standing postures include a forward head, locked knees and/or an excessive swayback. WALKING Walk with an upright posture. Your ears, shoulders and hips should all line-up. PROLONGED ACTIVITY IN A FLEXED POSITION When completing a task that requires you to bend forward at your waist or lean over a low surface, try to find a way to stabilize 3 out of 4 of your limbs. You can place a hand or elbow on your thigh or rest a knee on the surface you are reaching across. This will provide you more stability so that your muscles do not fatigue as quickly. By keeping your knees relaxed, or slightly bent, you will also reduce stress across your lower back. CORRECT LIFTING TECHNIQUES DO :   Assume a wide stance. This will provide you more stability and the opportunity to get as close as possible to the object which you are lifting.  Tense your abdominals to brace your spine. Bend at the knees and hips. Keeping your back locked in a neutral-spine position, lift using your leg muscles. Lift with your legs, keeping your back straight.  Test the weight of unknown objects before attempting to lift them.  Try to keep your elbows locked  down at your sides in order get the best strength from your shoulders when carrying an object.  Always ask for  help when lifting heavy or awkward objects. INCORRECT LIFTING TECHNIQUES DO NOT:   Lock your knees when lifting, even if it is a small object.  Bend and twist. Pivot at your feet or move your feet when needing to change directions.  Assume that you can safely pick up even a paper clip without proper posture.   This information is not intended to replace advice given to you by your health care provider. Make sure you discuss any questions you have with your health care provider.   Document Released: 06/12/2005 Document Revised: 07/03/2014 Document Reviewed: 09/24/2008 [employer] Interactive Patient Education [employer]
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
## 2015-05-14 NOTE — Assessment & Plan Note (Signed)
Elbow with good range of motion and no laxity present. Question of pain is out of proportion to actual exam. Continue conservative treatment at this time. Home exercise therapy and follow-up if symptoms worsen or fail to improve.

## 2015-05-14 NOTE — Progress Notes (Signed)
Pre visit review using our clinic review tool, if applicable. No additional management support is needed unless otherwise documented below in the visit note. 

## 2015-05-14 NOTE — Assessment & Plan Note (Addendum)
Chronic back pain with multiple areas of muscle tightness and weakness. Home exercise therapy reinitiated. Refer to physical therapy for further assessment and evaluation. Consider TENS for pain control. Start Norco 5-3 25. Refer to pain management for further assessment. Follow-up if symptoms worsen or fail to improve. Urine drug screen completed. [place] controlled substance database reviewed with no irregularities.

## 2015-05-17 NOTE — Telephone Encounter (Signed)
Patient has some questions to ask you regarding the pain management. Can you please give him a call at

## 2015-05-17 NOTE — Telephone Encounter (Signed)
[phone] °

## 2015-05-18 ENCOUNTER — Telehealth: Payer: Self-pay | Admitting: Family

## 2015-05-18 NOTE — Telephone Encounter (Signed)
Pt wants to know if we are waiting on the UDS to determine if he gets more pain medication?  He states he will run out over the holidays. Please advise

## 2015-05-18 NOTE — Telephone Encounter (Signed)
Spoke with pt and made him aware that his UDS has not come back yet.

## 2015-05-18 NOTE — Telephone Encounter (Signed)
Pt called inquiring about his urine drug screening.  Can you please call him back

## 2015-05-19 ENCOUNTER — Other Ambulatory Visit: Payer: Self-pay | Admitting: Family

## 2015-05-19 DIAGNOSIS — M549 Dorsalgia, unspecified: Principal | ICD-10-CM

## 2015-05-19 DIAGNOSIS — M25522 Pain in left elbow: Secondary | ICD-10-CM

## 2015-05-19 DIAGNOSIS — M25572 Pain in left ankle and joints of left foot: Secondary | ICD-10-CM

## 2015-05-19 DIAGNOSIS — G8929 Other chronic pain: Secondary | ICD-10-CM

## 2015-05-19 MED ORDER — HYDROCODONE-ACETAMINOPHEN 5-325 MG PO TABS: 1.0000 | ORAL_TABLET | Freq: Three times a day (TID) | ORAL | Status: DC | PRN

## 2015-05-19 NOTE — Telephone Encounter (Signed)
Pt aware.

## 2015-05-19 NOTE — Telephone Encounter (Signed)
A prescription for refill for another week while we await the drug screen results has been printed.

## 2015-05-25 ENCOUNTER — Telehealth: Payer: Self-pay | Admitting: Family

## 2015-05-25 DIAGNOSIS — M549 Dorsalgia, unspecified: Principal | ICD-10-CM

## 2015-05-25 DIAGNOSIS — M25522 Pain in left elbow: Secondary | ICD-10-CM

## 2015-05-25 DIAGNOSIS — M25572 Pain in left ankle and joints of left foot: Secondary | ICD-10-CM

## 2015-05-25 DIAGNOSIS — G8929 Other chronic pain: Secondary | ICD-10-CM

## 2015-05-25 NOTE — Telephone Encounter (Signed)
Pt is calling again regarding the encounter below. Also, he is requesting a refill for carisoprodol (SOMA) 350 MG tablet [[id number] since he thinks it will be a while before he can get into pain mgmt

## 2015-05-25 NOTE — Telephone Encounter (Signed)
Pt called in and is wanting a nurse to give him a call with his lab results    Best number [phone]

## 2015-05-25 NOTE — Telephone Encounter (Signed)
Returned pts call to let him know that his UDS came back fine and that you will refill his pain meds and soma. I stated that you would probably not get to refill his medication until tomorrow bc you were in clinic. Pt understands.

## 2015-05-26 MED ORDER — CARISOPRODOL 350 MG PO TABS: 350.0000 mg | ORAL_TABLET | Freq: Three times a day (TID) | ORAL | Status: DC

## 2015-05-26 MED ORDER — HYDROCODONE-ACETAMINOPHEN 5-325 MG PO TABS: 1.0000 | ORAL_TABLET | Freq: Three times a day (TID) | ORAL | Status: DC | PRN

## 2015-05-26 NOTE — Telephone Encounter (Signed)
Medications sent to pharmacy and printed.

## 2015-05-26 NOTE — Telephone Encounter (Signed)
Pt aware.

## 2015-06-01 ENCOUNTER — Encounter: Payer: Self-pay | Admitting: Family

## 2015-06-08 ENCOUNTER — Telehealth: Payer: Self-pay | Admitting: Family

## 2015-06-08 DIAGNOSIS — F411 Generalized anxiety disorder: Secondary | ICD-10-CM

## 2015-06-08 MED ORDER — ESCITALOPRAM OXALATE 20 MG PO TABS: 20.0000 mg | ORAL_TABLET | Freq: Every day | ORAL | Status: DC

## 2015-06-08 MED ORDER — QUETIAPINE FUMARATE 50 MG PO TABS: 50.0000 mg | ORAL_TABLET | Freq: Every day | ORAL | Status: DC

## 2015-06-08 MED ORDER — CLONAZEPAM 1 MG PO TABS: 1.0000 mg | ORAL_TABLET | Freq: Every day | ORAL | Status: DC | PRN

## 2015-06-08 MED ORDER — GABAPENTIN 100 MG PO CAPS: 200.0000 mg | ORAL_CAPSULE | Freq: Two times a day (BID) | ORAL | Status: DC

## 2015-06-08 NOTE — Telephone Encounter (Signed)
Last refills were 10/14

## 2015-06-08 NOTE — Telephone Encounter (Signed)
Medications refilled and printed.

## 2015-06-08 NOTE — Telephone Encounter (Signed)
Pt requesting refills for escitalopram (LEXAPRO) 20 MG tablet [[id number], QUEtiapine (SEROQUEL) 50 MG tablet [[id number], gabapentin (NEURONTIN) 100 MG capsule [[id number]][[id number]] and clonazePAM (KLONOPIN) 1 MG tablet [[id number] [facility] [address]

## 2015-06-09 NOTE — Telephone Encounter (Signed)
Rxs faxed and pt is aware.

## 2015-06-10 ENCOUNTER — Telehealth: Payer: Self-pay | Admitting: Family

## 2015-06-10 NOTE — Telephone Encounter (Signed)
Rec'd from [facility]

## 2015-06-17 ENCOUNTER — Other Ambulatory Visit: Payer: Self-pay

## 2015-06-17 NOTE — Telephone Encounter (Signed)
Pt rf rq for Soma 350 mg.   pls advise if this is okay to rf

## 2015-06-17 NOTE — Telephone Encounter (Signed)
Too soon for soma, as this should last at least one more work before need for refill, thanks

## 2015-07-02 ENCOUNTER — Ambulatory Visit: Payer: BC Managed Care – PPO

## 2015-07-06 ENCOUNTER — Telehealth: Payer: Self-pay | Admitting: *Deleted

## 2015-07-06 DIAGNOSIS — IMO0001 Reserved for inherently not codable concepts without codable children: Secondary | ICD-10-CM

## 2015-07-06 DIAGNOSIS — F411 Generalized anxiety disorder: Secondary | ICD-10-CM

## 2015-07-06 DIAGNOSIS — E1165 Type 2 diabetes mellitus with hyperglycemia: Secondary | ICD-10-CM

## 2015-07-06 MED ORDER — CLONAZEPAM 1 MG PO TABS: 1.0000 mg | ORAL_TABLET | Freq: Every day | ORAL | Status: DC | PRN

## 2015-07-06 MED ORDER — CARISOPRODOL 350 MG PO TABS: 350.0000 mg | ORAL_TABLET | Freq: Three times a day (TID) | ORAL | Status: DC

## 2015-07-06 MED ORDER — METFORMIN HCL 1000 MG PO TABS: 1000.0000 mg | ORAL_TABLET | Freq: Two times a day (BID) | ORAL | Status: DC

## 2015-07-06 NOTE — Telephone Encounter (Signed)
Pt left msg on triage requesting refills on his Ambien, Klonopin, Soma, and Metformin...Raechel Chute/lmb

## 2015-07-06 NOTE — Telephone Encounter (Signed)
Medications refilled. Have never prescribed Ambien and no evidence of Ambien on his list.

## 2015-07-07 NOTE — Telephone Encounter (Signed)
Notified pt with md response. [facility], also made appt to discuss insomnia issues 07/13/15...Raechel Chute/lmb

## 2015-07-13 ENCOUNTER — Ambulatory Visit: Payer: Self-pay | Admitting: Family

## 2015-07-14 ENCOUNTER — Telehealth: Payer: Self-pay | Admitting: Family

## 2015-07-14 DIAGNOSIS — M25522 Pain in left elbow: Principal | ICD-10-CM

## 2015-07-14 DIAGNOSIS — M25572 Pain in left ankle and joints of left foot: Secondary | ICD-10-CM

## 2015-07-14 DIAGNOSIS — G8929 Other chronic pain: Secondary | ICD-10-CM

## 2015-07-14 NOTE — Telephone Encounter (Signed)
Referral sent 

## 2015-07-14 NOTE — Telephone Encounter (Signed)
Patient currently goes to [facility].  He is not happy with the provider here.  He is requesting [facility] to be referred to.

## 2015-07-22 ENCOUNTER — Encounter: Payer: Self-pay | Admitting: Family

## 2015-07-22 ENCOUNTER — Ambulatory Visit (INDEPENDENT_AMBULATORY_CARE_PROVIDER_SITE_OTHER): Payer: BC Managed Care – PPO | Admitting: Family

## 2015-07-22 ENCOUNTER — Ambulatory Visit: Payer: BC Managed Care – PPO | Attending: Family

## 2015-07-22 VITALS — BP 150/98 | HR 110 | Temp 98.8°F | Resp 18 | Ht 67.0 in | Wt 262.0 lb

## 2015-07-22 DIAGNOSIS — G4700 Insomnia, unspecified: Secondary | ICD-10-CM

## 2015-07-22 DIAGNOSIS — IMO0001 Reserved for inherently not codable concepts without codable children: Secondary | ICD-10-CM

## 2015-07-22 DIAGNOSIS — G4733 Obstructive sleep apnea (adult) (pediatric): Secondary | ICD-10-CM | POA: Insufficient documentation

## 2015-07-22 DIAGNOSIS — G8929 Other chronic pain: Secondary | ICD-10-CM

## 2015-07-22 DIAGNOSIS — M25572 Pain in left ankle and joints of left foot: Secondary | ICD-10-CM

## 2015-07-22 DIAGNOSIS — M25522 Pain in left elbow: Secondary | ICD-10-CM

## 2015-07-22 DIAGNOSIS — M549 Dorsalgia, unspecified: Secondary | ICD-10-CM | POA: Diagnosis not present

## 2015-07-22 DIAGNOSIS — E1165 Type 2 diabetes mellitus with hyperglycemia: Secondary | ICD-10-CM

## 2015-07-22 MED ORDER — ZOLPIDEM TARTRATE 5 MG PO TABS: 5.0000 mg | ORAL_TABLET | Freq: Every evening | ORAL | Status: DC | PRN

## 2015-07-22 MED ORDER — HYDROCODONE-ACETAMINOPHEN 5-325 MG PO TABS: 1.0000 | ORAL_TABLET | Freq: Three times a day (TID) | ORAL | Status: DC | PRN

## 2015-07-22 NOTE — Progress Notes (Signed)
Pre visit review using our clinic review tool, if applicable. No additional management support is needed unless otherwise documented below in the visit note. 

## 2015-07-22 NOTE — Progress Notes (Signed)
Subjective:    Patient ID: Jason [name], male    DOB: January 30, 1971, 45 y.o.   MRN: [id number]  Chief Complaint  Patient presents with  . Medication Management    Pt had an issue with [facility] needs help with medication, sleep apnea referral    HPI:  Jason [name] is a 45 y.o. male who  has a past medical history of Back pain; Depression; Chicken pox; Diabetes mellitus without complication (HCC); Hypertension; Hyperlipidemia; Kidney stones; and Ulceration of colon. and presents today for a follow up office visit.  1.) Sleep apnea - Previously diagnosed with sleep apnea and maintained on CPAP. His last sleep was done in 2004 and has been using the same equipment for approximately 13 years and is requesting an update machine and equipment with sleep study if needed. Sleep is significantly improved when he does not use it. Does have less daytime sleepiness.   2.) Chronic pain - Associated symptom of back pain and ankle pain has been controlled with hydrocodone-acetaminophen. Takes the medication as prescribed and denies adverse side effects or constipation. Recently evaluated in a local pain clinic and had a very poor experience and would like to be referred  Allergies  Allergen Reactions  . Ibuprofen Hives  . Lactose Intolerance (Gi) Other (See Comments)    Gi Upset  . Tape Itching, Rash and Other (See Comments)    Paper tape     Current Outpatient Prescriptions on File Prior to Visit  Medication Sig Dispense Refill  . carisoprodol (SOMA) 350 MG tablet Take 1 tablet (350 mg total) by mouth 3 (three) times daily. 90 tablet 0  . clonazePAM (KLONOPIN) 1 MG tablet Take 1 tablet (1 mg total) by mouth daily as needed for anxiety. 30 tablet 0  . escitalopram (LEXAPRO) 20 MG tablet Take 1 tablet (20 mg total) by mouth daily. 30 tablet 2  . gabapentin (NEURONTIN) 100 MG capsule Take 2 capsules (200 mg total) by mouth 2 (two) times daily. 120 capsule 2  . glyBURIDE (DIABETA) 5 MG tablet Take  1 tablet (5 mg total) by mouth 2 (two) times daily. 90 tablet 0  . metFORMIN (GLUCOPHAGE) 1000 MG tablet Take 1 tablet (1,000 mg total) by mouth 2 (two) times daily with a meal. 60 tablet 3  . omeprazole (PRILOSEC) 20 MG capsule Take 1 capsule (20 mg total) by mouth daily.    . QUEtiapine (SEROQUEL) 50 MG tablet Take 1 tablet (50 mg total) by mouth at bedtime. 30 tablet 2  . valsartan (DIOVAN) 160 MG tablet Take 1 tablet (160 mg total) by mouth daily. 90 tablet 1   No current facility-administered medications on file prior to visit.     Review of Systems  Constitutional: Negative for fever and chills.  Respiratory: Negative for chest tightness and shortness of breath.   Cardiovascular: Negative for chest pain, palpitations and leg swelling.  Musculoskeletal: Positive for back pain.  Psychiatric/Behavioral: Positive for sleep disturbance.      Objective:    BP 150/98 mmHg  Pulse 110  Temp(Src) 98.8 F (37.1 C) (Oral)  Resp 18  Ht  (1.702 m)  Wt 262 lb (118.842 kg)  BMI 41.03 kg/m2  SpO2 91% Nursing note and vital signs reviewed.  Physical Exam  Constitutional: He is oriented to person, place, and time. He appears well-developed and well-nourished. No distress.  Cardiovascular: Normal rate, regular rhythm, normal heart sounds and intact distal pulses.   Pulmonary/Chest: Effort normal and breath sounds  normal.  Neurological: He is alert and oriented to person, place, and time.  Skin: Skin is warm and dry.  Psychiatric: He has a normal mood and affect. His behavior is normal. Judgment and thought content normal.       Assessment & Plan:   Problem List Items Addressed This Visit      Respiratory   Obstructive sleep apnea - Primary    Previously diagnosed with sleep apnea and due for sleep study and no equipment. Refer to pulmonology for further management of sleep apnea and potential need for sleep study.      Relevant Orders   Ambulatory referral to Pulmonology       Endocrine   Type 2 diabetes mellitus, uncontrolled (HCC)   Relevant Orders   Hemoglobin A1c     Other   Insomnia    Insomnia currently managed with Ambien. Decrease Ambien to 5 mg nightly as needed. Continue use of CPAP at night and follow-up with pulmonology for adjustments. Follow-up if symptoms worsen or are no longer improved with current medication regimen.      Relevant Medications   zolpidem (AMBIEN) 5 MG tablet   Chronic back pain    Chronic back pain adequately managed with hydrocodone-acetaminophen without adverse side effect. Refer to new pain clinic per patient request. Continue current dosage of hydrocodone-acetaminophen. [place] controlled substance database reviewed with no irregularities.      Relevant Medications   HYDROcodone-acetaminophen (NORCO/VICODIN) 5-325 MG tablet   Other Relevant Orders   Ambulatory referral to Pain Clinic   Chronic elbow pain   Relevant Medications   HYDROcodone-acetaminophen (NORCO/VICODIN) 5-325 MG tablet   Other Relevant Orders   Ambulatory referral to Pain Clinic   Chronic pain of left ankle   Relevant Medications   HYDROcodone-acetaminophen (NORCO/VICODIN) 5-325 MG tablet   Other Relevant Orders   Ambulatory referral to Pain Clinic

## 2015-07-22 NOTE — Assessment & Plan Note (Signed)
Previously diagnosed with sleep apnea and due for sleep study and no equipment. Refer to pulmonology for further management of sleep apnea and potential need for sleep study.

## 2015-07-22 NOTE — Assessment & Plan Note (Signed)
Insomnia currently managed with Ambien. Decrease Ambien to 5 mg nightly as needed. Continue use of CPAP at night and follow-up with pulmonology for adjustments. Follow-up if symptoms worsen or are no longer improved with current medication regimen.

## 2015-07-22 NOTE — Assessment & Plan Note (Signed)
Chronic back pain adequately managed with hydrocodone-acetaminophen without adverse side effect. Refer to new pain clinic per patient request. Continue current dosage of hydrocodone-acetaminophen. [place] controlled substance database reviewed with no irregularities.

## 2015-07-22 NOTE — Patient Instructions (Addendum)
Thank you for choosing  HealthCare.  Summary/Instructions:  Please continue to take your medications as prescribed.   Your prescription(s) have been submitted to your pharmacy or been printed and provided for you. Please take as directed and contact our office if you believe you are having problem(s) with the medication(s) or have any questions.  If your symptoms worsen or fail to improve, please contact our office for further instruction, or in case of emergency go directly to the emergency room at the closest medical facility.     

## 2015-08-03 ENCOUNTER — Telehealth: Payer: Self-pay | Admitting: *Deleted

## 2015-08-03 DIAGNOSIS — F411 Generalized anxiety disorder: Secondary | ICD-10-CM

## 2015-08-03 NOTE — Telephone Encounter (Signed)
Receive call pt states he is needing refills on his Soma & Clonazepam.../lmb

## 2015-08-04 MED ORDER — CLONAZEPAM 1 MG PO TABS: 1.0000 mg | ORAL_TABLET | Freq: Every day | ORAL | Status: DC | PRN

## 2015-08-04 MED ORDER — CARISOPRODOL 350 MG PO TABS: 350.0000 mg | ORAL_TABLET | Freq: Three times a day (TID) | ORAL | Status: DC

## 2015-08-04 NOTE — Telephone Encounter (Signed)
Pls advise on  msg below. mb

## 2015-08-04 NOTE — Telephone Encounter (Signed)
Notified pt Jason [name] ok both rx's has [facility]...Raechel Chute

## 2015-08-04 NOTE — Telephone Encounter (Signed)
Medication refilled

## 2015-08-18 ENCOUNTER — Telehealth: Payer: Self-pay | Admitting: *Deleted

## 2015-08-18 DIAGNOSIS — G4700 Insomnia, unspecified: Secondary | ICD-10-CM

## 2015-08-18 MED ORDER — ZOLPIDEM TARTRATE 5 MG PO TABS: 5.0000 mg | ORAL_TABLET | Freq: Every evening | ORAL | Status: DC | PRN

## 2015-08-18 NOTE — Telephone Encounter (Signed)
Left msg on triage requesting refills on his ambien...Raechel Chute

## 2015-08-18 NOTE — Telephone Encounter (Signed)
Notified pt [facility].../lmb 

## 2015-08-18 NOTE — Telephone Encounter (Signed)
Medication refilled

## 2015-08-20 ENCOUNTER — Other Ambulatory Visit (INDEPENDENT_AMBULATORY_CARE_PROVIDER_SITE_OTHER): Payer: BC Managed Care – PPO

## 2015-08-20 ENCOUNTER — Other Ambulatory Visit: Payer: BC Managed Care – PPO

## 2015-08-20 DIAGNOSIS — IMO0001 Reserved for inherently not codable concepts without codable children: Secondary | ICD-10-CM

## 2015-08-20 DIAGNOSIS — E1165 Type 2 diabetes mellitus with hyperglycemia: Secondary | ICD-10-CM

## 2015-08-20 LAB — BASIC METABOLIC PANEL
BUN: 10 mg/dL (ref 6–23)
CHLORIDE: 96 meq/L (ref 96–112)
CO2: 25 meq/L (ref 19–32)
Calcium: 9 mg/dL (ref 8.4–10.5)
Creatinine, Ser: 0.86 mg/dL (ref 0.40–1.50)
GFR: 102.23 mL/min (ref >60.00)
Glucose, Bld: 415 mg/dL — ABNORMAL HIGH (ref 70–99)
POTASSIUM: 4 meq/L (ref 3.5–5.1)
Sodium: 131 mEq/L — ABNORMAL LOW (ref 135–145)

## 2015-08-20 LAB — HEMOGLOBIN A1C: HEMOGLOBIN A1C: 10.7 % — AB (ref 4.6–6.5)

## 2015-08-23 ENCOUNTER — Ambulatory Visit: Payer: Self-pay | Admitting: Family

## 2015-08-25 ENCOUNTER — Other Ambulatory Visit: Payer: BC Managed Care – PPO

## 2015-08-25 ENCOUNTER — Telehealth: Payer: Self-pay | Admitting: *Deleted

## 2015-08-25 NOTE — Telephone Encounter (Signed)
Left msg on triage requesting refill on his hydrocodone.../lmb 

## 2015-08-26 ENCOUNTER — Ambulatory Visit (INDEPENDENT_AMBULATORY_CARE_PROVIDER_SITE_OTHER): Payer: BC Managed Care – PPO | Admitting: Family

## 2015-08-26 ENCOUNTER — Encounter: Payer: Self-pay | Admitting: Family

## 2015-08-26 VITALS — BP 142/90 | HR 91 | Temp 98.2°F | Resp 16 | Ht 67.0 in | Wt 208.0 lb

## 2015-08-26 DIAGNOSIS — M549 Dorsalgia, unspecified: Secondary | ICD-10-CM | POA: Diagnosis not present

## 2015-08-26 DIAGNOSIS — M25522 Pain in left elbow: Secondary | ICD-10-CM | POA: Diagnosis not present

## 2015-08-26 DIAGNOSIS — M25572 Pain in left ankle and joints of left foot: Secondary | ICD-10-CM | POA: Diagnosis not present

## 2015-08-26 DIAGNOSIS — G8929 Other chronic pain: Secondary | ICD-10-CM

## 2015-08-26 MED ORDER — GABAPENTIN 100 MG PO CAPS: 200.0000 mg | ORAL_CAPSULE | Freq: Two times a day (BID) | ORAL | Status: DC

## 2015-08-26 MED ORDER — HYDROCODONE-ACETAMINOPHEN 5-325 MG PO TABS: 1.0000 | ORAL_TABLET | Freq: Three times a day (TID) | ORAL | Status: AC | PRN

## 2015-08-26 NOTE — Patient Instructions (Signed)
Thank you for choosing [facility].  Summary/Instructions:  Your prescription(s) have been submitted to your pharmacy or been printed and provided for you. Please take as directed and contact our office if you believe you are having problem(s) with the medication(s) or have any questions.  If your symptoms worsen or fail to improve, please contact our office for further instruction, or in case of emergency go directly to the emergency room at the closest medical facility.   Please check your insurance for your diabetes coverage and let us know what medications are covered.

## 2015-08-26 NOTE — Telephone Encounter (Signed)
Pt actually have appt this afternoon @ 3:45... Can address refill at appt time...Raechel Chute

## 2015-08-26 NOTE — Assessment & Plan Note (Signed)
Continues to experience pain located in his back, elbow and ankle that is adequately managed with the current medication regimen without side effects or constipation. Has new follow up pain management appointment at the end of the month. Continue current dosage of gabapentin, carisoprodol and hydrocodone-acetaminophen. Follow up with pain management as scheduled.

## 2015-08-26 NOTE — Progress Notes (Signed)
Pre visit review using our clinic review tool, if applicable. No additional management support is needed unless otherwise documented below in the visit note. 

## 2015-08-26 NOTE — Progress Notes (Signed)
Subjective:    Patient ID: Jason [name], male    DOB: 06-25-1971, 45 y.o.   MRN: [id number]  Chief Complaint  Patient presents with  . Follow-up    needs refill of pain meds, has an appointment set up for the end of the month with pain medication    HPI:  Jason [name] is a 44 y.o. male who  has a past medical history of Back pain; Depression; Chicken pox; Diabetes mellitus without complication (HCC); Hypertension; Hyperlipidemia; Kidney stones; and Ulceration of colon. and presents today for a follow up.  Chronic pain of multiple joints - Currently maintained on Soma, gabapentin, and hydrocodone-acetaminophen. Takes medication as prescribed and denies adverse side effects or constipation.. Continues to experience associated symptom of pain which is more manageable with the current regimen. Able to function on a daily basis with the current regimen. He has an appointment with [facility].   Allergies  Allergen Reactions  . Ibuprofen Hives  . Lactose Intolerance (Gi) Other (See Comments)    Gi Upset  . Tape Itching, Rash and Other (See Comments)    Paper tape     Current Outpatient Prescriptions on File Prior to Visit  Medication Sig Dispense Refill  . carisoprodol (SOMA) 350 MG tablet Take 1 tablet (350 mg total) by mouth 3 (three) times daily. 90 tablet 0  . clonazePAM (KLONOPIN) 1 MG tablet Take 1 tablet (1 mg total) by mouth daily as needed for anxiety. 30 tablet 0  . escitalopram (LEXAPRO) 20 MG tablet Take 1 tablet (20 mg total) by mouth daily. 30 tablet 2  . glyBURIDE (DIABETA) 5 MG tablet Take 1 tablet (5 mg total) by mouth 2 (two) times daily. 90 tablet 0  . metFORMIN (GLUCOPHAGE) 1000 MG tablet Take 1 tablet (1,000 mg total) by mouth 2 (two) times daily with a meal. 60 tablet 3  . omeprazole (PRILOSEC) 20 MG capsule Take 1 capsule (20 mg total) by mouth daily.    . QUEtiapine (SEROQUEL) 50 MG tablet Take 1 tablet (50 mg total) by mouth at bedtime. 30 tablet 2    . valsartan (DIOVAN) 160 MG tablet Take 1 tablet (160 mg total) by mouth daily. 90 tablet 1  . zolpidem (AMBIEN) 5 MG tablet Take 1 tablet (5 mg total) by mouth at bedtime as needed for sleep. 30 tablet 0   No current facility-administered medications on file prior to visit.    Review of Systems  Constitutional: Negative for fever and chills.  Respiratory: Negative for chest tightness and shortness of breath.   Cardiovascular: Negative for chest pain, palpitations and leg swelling.  Neurological: Negative for weakness and light-headedness.      Objective:    BP 142/90 mmHg  Pulse 91  Temp(Src) 98.2 F (36.8 C) (Oral)  Resp 16  Ht  (1.702 m)  Wt 208 lb (94.348 kg)  BMI 32.57 kg/m2  SpO2 94% Nursing note and vital signs reviewed.  Physical Exam  Constitutional: He is oriented to person, place, and time. He appears well-developed and well-nourished. No distress.  Cardiovascular: Normal rate, regular rhythm, normal heart sounds and intact distal pulses.   Pulmonary/Chest: Effort normal and breath sounds normal.  Neurological: He is alert and oriented to person, place, and time.  Skin: Skin is warm and dry.  Psychiatric: He has a normal mood and affect. His behavior is normal. Judgment and thought content normal.       Assessment & Plan:   Problem List Items  Addressed This Visit      Other   Chronic back pain - Primary    Continues to experience pain located in his back, elbow and ankle that is adequately managed with the current medication regimen without side effects or constipation. Has new follow up pain management appointment at the end of the month. Continue current dosage of gabapentin, carisoprodol and hydrocodone-acetaminophen. Follow up with pain management as scheduled.       Relevant Medications   HYDROcodone-acetaminophen (NORCO/VICODIN) 5-325 MG tablet   Chronic elbow pain   Relevant Medications   HYDROcodone-acetaminophen (NORCO/VICODIN) 5-325 MG  tablet   gabapentin (NEURONTIN) 100 MG capsule   Chronic pain of left ankle   Relevant Medications   HYDROcodone-acetaminophen (NORCO/VICODIN) 5-325 MG tablet   gabapentin (NEURONTIN) 100 MG capsule

## 2015-08-30 ENCOUNTER — Telehealth: Payer: Self-pay | Admitting: *Deleted

## 2015-08-30 DIAGNOSIS — IMO0001 Reserved for inherently not codable concepts without codable children: Secondary | ICD-10-CM

## 2015-08-30 DIAGNOSIS — G4700 Insomnia, unspecified: Secondary | ICD-10-CM

## 2015-08-30 DIAGNOSIS — E1165 Type 2 diabetes mellitus with hyperglycemia: Secondary | ICD-10-CM

## 2015-08-30 NOTE — Telephone Encounter (Signed)
Left msg on triage requesting refills on his soma, gabapentin, glyburide, and ambien...Raechel Chute/lmb

## 2015-08-31 ENCOUNTER — Other Ambulatory Visit: Payer: Self-pay | Admitting: Family

## 2015-08-31 MED ORDER — CARISOPRODOL 350 MG PO TABS: 350.0000 mg | ORAL_TABLET | Freq: Three times a day (TID) | ORAL | Status: AC

## 2015-08-31 MED ORDER — GLYBURIDE 5 MG PO TABS: 5.0000 mg | ORAL_TABLET | Freq: Two times a day (BID) | ORAL | Status: AC

## 2015-08-31 MED ORDER — ZOLPIDEM TARTRATE 5 MG PO TABS: 5.0000 mg | ORAL_TABLET | Freq: Every evening | ORAL | Status: DC | PRN

## 2015-08-31 NOTE — Telephone Encounter (Signed)
Medications refilled

## 2015-08-31 NOTE — Telephone Encounter (Signed)
Last refill was 08/04/15

## 2015-08-31 NOTE — Telephone Encounter (Signed)
Pls advise on msg below.../lmb 

## 2015-08-31 NOTE — Telephone Encounter (Signed)
Notified pt refills has been approved & sent to [facility]...Raechel Chute/lmb

## 2015-09-13 ENCOUNTER — Other Ambulatory Visit: Payer: Self-pay | Admitting: Family

## 2015-09-21 ENCOUNTER — Telehealth: Payer: Self-pay | Admitting: *Deleted

## 2015-09-21 DIAGNOSIS — G4700 Insomnia, unspecified: Secondary | ICD-10-CM

## 2015-09-21 NOTE — Telephone Encounter (Signed)
Pt stated these meds are for next month, Ambien and Clonazepam is for 10/01/15 and Seroquel is for 10/14/15. Please call pt back

## 2015-09-21 NOTE — Telephone Encounter (Signed)
Requesting refills on is Clonazepam, Seroquel, and Ambien...Raechel Chute/lmb

## 2015-09-21 NOTE — Telephone Encounter (Signed)
Prescriptions are not ready to be refilled - is this for the upcoming month? If so I will date them appropriately.

## 2015-09-22 NOTE — Telephone Encounter (Signed)
Patient called back in regards.  I notified him that the scripts would be sent as soon as it was time

## 2015-09-23 MED ORDER — CLONAZEPAM 1 MG PO TABS: ORAL_TABLET | ORAL | Status: DC

## 2015-09-23 MED ORDER — QUETIAPINE FUMARATE 50 MG PO TABS: 50.0000 mg | ORAL_TABLET | Freq: Every day | ORAL | Status: DC

## 2015-09-23 MED ORDER — ZOLPIDEM TARTRATE 5 MG PO TABS: 5.0000 mg | ORAL_TABLET | Freq: Every evening | ORAL | Status: DC | PRN

## 2015-09-23 NOTE — Telephone Encounter (Signed)
Faxed script [facility] to [facility].../lmb 

## 2015-09-23 NOTE — Telephone Encounter (Signed)
Medications refilled

## 2015-09-23 NOTE — Addendum Note (Signed)
Addended by: Jeanine LuzALONE, Milica Gully D on: 09/23/2015 01:13 PM   Modules accepted: Orders

## 2015-09-28 ENCOUNTER — Other Ambulatory Visit: Payer: Self-pay | Admitting: Family

## 2015-09-28 NOTE — Telephone Encounter (Signed)
Patient states pharmacy has not received script for [place]. Please follow up with patient in regards.

## 2015-09-28 NOTE — Telephone Encounter (Signed)
Called [facility] spoke woth pharmacist verified if rx was received on 3/30 he stated they did not received. Gave verbally order form 3/30...Raechel Chute/lmb

## 2015-09-30 ENCOUNTER — Telehealth: Payer: Self-pay | Admitting: Family

## 2015-09-30 NOTE — Telephone Encounter (Signed)
Patient states pharmacy has not received script for [place].  Please refax.

## 2015-09-30 NOTE — Telephone Encounter (Signed)
Pt called back stated that pharmacy got the med, cancel this request. FYI

## 2015-10-20 ENCOUNTER — Telehealth: Payer: Self-pay | Admitting: *Deleted

## 2015-10-20 DIAGNOSIS — G4700 Insomnia, unspecified: Secondary | ICD-10-CM

## 2015-10-20 MED ORDER — ZOLPIDEM TARTRATE 5 MG PO TABS: 5.0000 mg | ORAL_TABLET | Freq: Every evening | ORAL | Status: DC | PRN

## 2015-10-20 MED ORDER — OMEPRAZOLE 20 MG PO CPDR: 20.0000 mg | DELAYED_RELEASE_CAPSULE | Freq: Every day | ORAL | Status: AC

## 2015-10-20 MED ORDER — CLONAZEPAM 1 MG PO TABS: ORAL_TABLET | ORAL | Status: DC

## 2015-10-20 NOTE — Telephone Encounter (Signed)
Medications refilled

## 2015-10-20 NOTE — Telephone Encounter (Signed)
Notified pt rx's approved will fax to [facility]...Raechel Chute/lmb

## 2015-10-20 NOTE — Telephone Encounter (Signed)
Left msg on triage needing refill on his omeprazole, ambien and clonazepam. Sent omeprazole pls advise on controls...Raechel Chute/lmb

## 2015-11-01 ENCOUNTER — Institutional Professional Consult (permissible substitution): Payer: Self-pay | Admitting: Pulmonary Disease

## 2015-11-01 ENCOUNTER — Telehealth: Payer: Self-pay | Admitting: *Deleted

## 2015-11-01 DIAGNOSIS — G4700 Insomnia, unspecified: Secondary | ICD-10-CM

## 2015-11-01 MED ORDER — ZOLPIDEM TARTRATE 10 MG PO TABS: 10.0000 mg | ORAL_TABLET | Freq: Every evening | ORAL | Status: DC | PRN

## 2015-11-01 NOTE — Telephone Encounter (Signed)
No sure if they will refill if he just had the other prescription filled.

## 2015-11-01 NOTE — Telephone Encounter (Signed)
Received call pt states the ambien 5 mg is not effective would like to go back on the 10 mg.../lmb

## 2015-11-02 NOTE — Telephone Encounter (Signed)
Called pt no answer LMOM w/Greg response fax script to [facility]...Raechel Chute/lmb

## 2015-11-08 ENCOUNTER — Telehealth: Payer: Self-pay | Admitting: *Deleted

## 2015-11-08 NOTE — Telephone Encounter (Signed)
Left msg on triage requesting refill on his clonazepam...lmb

## 2015-11-10 NOTE — Telephone Encounter (Signed)
Per chart pt is not due for refill until 6/7... Too early for refill...Raechel Chute/lmb

## 2015-11-12 ENCOUNTER — Other Ambulatory Visit: Payer: Self-pay | Admitting: Family

## 2015-11-12 NOTE — Telephone Encounter (Signed)
Last refill:08/26/15

## 2015-11-23 ENCOUNTER — Telehealth: Payer: Self-pay | Admitting: *Deleted

## 2015-11-23 DIAGNOSIS — G4700 Insomnia, unspecified: Secondary | ICD-10-CM

## 2015-11-23 NOTE — Telephone Encounter (Signed)
Receive call pt requesting refill on his Ambien & Klonopin...Raechel Chute/lmb

## 2015-11-24 MED ORDER — CLONAZEPAM 1 MG PO TABS: ORAL_TABLET | ORAL | Status: DC

## 2015-11-24 MED ORDER — ZOLPIDEM TARTRATE 10 MG PO TABS: 10.0000 mg | ORAL_TABLET | Freq: Every evening | ORAL | Status: DC | PRN

## 2015-11-24 NOTE — Telephone Encounter (Signed)
Medications refilled and dated.

## 2015-11-25 ENCOUNTER — Ambulatory Visit (INDEPENDENT_AMBULATORY_CARE_PROVIDER_SITE_OTHER): Payer: Worker's Compensation | Admitting: Physician Assistant

## 2015-11-25 VITALS — BP 136/88 | HR 81 | Temp 98.1°F | Resp 18 | Ht 67.0 in | Wt 206.0 lb

## 2015-11-25 DIAGNOSIS — M545 Low back pain, unspecified: Secondary | ICD-10-CM

## 2015-11-25 DIAGNOSIS — Z026 Encounter for examination for insurance purposes: Secondary | ICD-10-CM | POA: Diagnosis not present

## 2015-11-25 NOTE — Progress Notes (Signed)
11/25/2015 2:32 PM   DOB: [birth year] / MRN: [id number]  SUBJECTIVE:  Jason [name] is a 45 y.o. male presenting for low back pain that is midline and severe in nature.  He has a history of same however reports he was lifting some things at work roughly 8 days ago and his pain suddenly became worse.  His pain is typically a 6-7/10 and today is a 9/10.  He is in pain management and takes opioids daily for pain, and has been needing a little more throughout this flare.  Denies paresthesia, weakness, bowel and bladder incontinence.   He is allergic to ibuprofen; lactose intolerance (gi); and tape.   He  has a past medical history of Back pain; Depression; Chicken pox; Diabetes mellitus without complication (HCC); Hypertension; Hyperlipidemia; Kidney stones; Ulceration of colon; and Anxiety.    He  reports that he has never smoked. He has never used smokeless tobacco. He reports that he drinks alcohol. He reports that he does not use illicit drugs. He  reports that he does not currently engage in sexual activity. He reports using the following method of birth control/protection: Condom. The patient  has past surgical history that includes Back surgery; Hernia repair; Fracture surgery; and Spine surgery.  His family history includes Cancer in his father; Congestive Heart Failure in his father; Diabetes in his father and mother; Heart disease in his father, mother, and sister; Hyperlipidemia in his father and mother; Hypertension in his father and mother; Stroke in his father.  Review of Systems  Constitutional: Negative for fever and chills.  Eyes: Negative for blurred vision.  Respiratory: Negative for cough and shortness of breath.   Cardiovascular: Negative for chest pain.  Gastrointestinal: Negative for nausea and abdominal pain.  Genitourinary: Negative for dysuria, urgency and frequency.  Musculoskeletal: Positive for myalgias and back pain. Negative for falls.  Skin: Negative for rash.    Neurological: Negative for dizziness, tingling and headaches.  Psychiatric/Behavioral: Negative for depression. The patient is not nervous/anxious.     Problem list and medications reviewed and updated by myself where necessary, and exist elsewhere in the encounter.   OBJECTIVE:  BP 136/88 mmHg  Pulse 81  Temp(Src) 98.1 F (36.7 C) (Oral)  Resp 18  Ht 5\' 7"  (1.702 m)  Wt 206 lb (93.441 kg)  BMI 32.26 kg/m2  SpO2 97%  Physical Exam  Constitutional: He is oriented to person, place, and time. He appears well-developed. He does not appear ill.  Eyes: Conjunctivae and EOM are normal. Pupils are equal, round, and reactive to light.  Cardiovascular: Normal rate.   Pulmonary/Chest: Effort normal.  Abdominal: He exhibits no distension.  Musculoskeletal: Normal range of motion.       Lumbar back: He exhibits tenderness, pain and spasm. He exhibits normal range of motion, no bony tenderness, no swelling, no edema, no deformity and no laceration.  Neurological: He is alert and oriented to person, place, and time. He has normal reflexes. No cranial nerve deficit or sensory deficit. Coordination and gait normal.  Skin: Skin is warm and dry. He is not diaphoretic.  Psychiatric: He has a normal mood and affect.  Nursing note and vitals reviewed.   No results found for this or any previous visit (from the past 72 hour(s)).  No results found.  ASSESSMENT AND PLAN  Roger ShelterGordon was seen today for back injury.  Diagnoses and all orders for this visit:  Encounter related to worker's compensation claim  Bilateral low back pain without sciatica:  Acute on chronic caused by repetitive lifting at work.  He is already in pain management.  I think the most reasonable thing to do at this point is to give him time away from work to rest.  He will RTC in 4 days for clearance and hopefully will be able to return to work without restriction at that time.      The patient was advised to call or return to  clinic if he does not see an improvement in symptoms or to seek the care of the closest emergency department if he worsens with the above plan.   Deliah Boston, MHS, PA-C [facility] 11/25/2015 2:32 PM

## 2015-11-25 NOTE — Telephone Encounter (Signed)
Notified pt [facility].../lmb 

## 2015-11-29 ENCOUNTER — Other Ambulatory Visit: Payer: Self-pay | Admitting: Family Medicine

## 2015-11-29 ENCOUNTER — Ambulatory Visit: Payer: Worker's Compensation

## 2015-11-29 ENCOUNTER — Ambulatory Visit (INDEPENDENT_AMBULATORY_CARE_PROVIDER_SITE_OTHER): Payer: Worker's Compensation | Admitting: Family Medicine

## 2015-11-29 VITALS — BP 126/90 | HR 95 | Temp 97.9°F | Resp 18 | Ht 67.0 in | Wt 206.0 lb

## 2015-11-29 DIAGNOSIS — M545 Low back pain, unspecified: Secondary | ICD-10-CM

## 2015-11-29 DIAGNOSIS — Z026 Encounter for examination for insurance purposes: Secondary | ICD-10-CM | POA: Diagnosis not present

## 2015-11-29 MED ORDER — TIZANIDINE HCL 4 MG PO TABS: 4.0000 mg | ORAL_TABLET | Freq: Three times a day (TID) | ORAL | Status: DC | PRN

## 2015-11-29 NOTE — Patient Instructions (Addendum)
IF you received an x-ray today, you will receive an invoice from [facility]. Please contact [facility] at [phone] with questions or concerns regarding your invoice.   IF you received labwork today, you will receive an invoice from [employer]. Please contact [facility] at [phone] with questions or concerns regarding your invoice.   Our billing staff will not be able to assist you with questions regarding bills from these companies.  You will be contacted with the lab results as soon as they are available. The fastest way to get your results is to activate your My Chart account. Instructions are located on the last page of this paperwork. If you have not heard from [place] regarding the results in 2 weeks, please contact this office.     Tizanidine tablets or capsules What is this medicine? TIZANIDINE (tye ZAN i deen) helps to relieve muscle spasms. It may be used to help in the treatment of multiple sclerosis and spinal cord injury. This medicine may be used for other purposes; ask your health care provider or pharmacist if you have questions. What should I tell my health care provider before I take this medicine? They need to know if you have any of these conditions: -kidney disease -liver disease -low blood pressure -mental disorder -an unusual or allergic reaction to tizanidine, other medicines, lactose (tablets only), foods, dyes, or preservatives -pregnant or trying to get pregnant -breast-feeding How should I use this medicine? Take this medicine by mouth with a full glass of water. Take this medicine on an empty stomach, at least 30 minutes before or 2 hours after food. Do not take with food unless you talk with your doctor. Follow the directions on the prescription label. Take your medicine at regular intervals. Do not take your medicine more often than directed. Do not stop taking except on your doctor's advice. Suddenly  stopping the medicine can be very dangerous. Talk to your pediatrician regarding the use of this medicine in children. Patients over 6 years old may have a stronger reaction and need a smaller dose. Overdosage: If you think you have taken too much of this medicine contact a poison control center or emergency room at once. NOTE: This medicine is only for you. Do not share this medicine with others. What if I miss a dose? If you miss a dose, take it as soon as you can. If it is almost time for your next dose, take only that dose. Do not take double or extra doses. What may interact with this medicine? Do not take this medicine with any of the following medications: -ciprofloxacin -clonidine -fluvoxamine -guanabenz -guanfacine -methyldopa This medicine may also interact with the following medications: -acyclovir -alcohol -antihistamines -baclofen -barbiturates like phenobarbital -benzodiazepines -cimetidine -famotidine -male hormones, like estrogens or progestins and birth control pills -medicines for high blood pressure -medicines for irregular heartbeat -medicines for pain like codeine, morphine, and hydrocodone -medicines for sleep -rofecoxib -some antibiotics like levofloxacin, ofloxacin -ticlopidine -zileuton This list may not describe all possible interactions. Give your health care provider a list of all the medicines, herbs, non-prescription drugs, or dietary supplements you use. Also tell them if you smoke, drink alcohol, or use illegal drugs. Some items may interact with your medicine. What should I watch for while using this medicine? You may get drowsy or dizzy. Do not drive, use machinery, or do anything that needs mental alertness until you know how this medicine affects you. Do not stand or sit up  quickly, especially if you are an older patient. This reduces the risk of dizzy or fainting spells. Alcohol may interfere with the effect of this medicine. Avoid alcoholic  drinks. Your mouth may get dry. Chewing sugarless gum or sucking hard candy, and drinking plenty of water may help. Contact your doctor if the problem does not go away or is severe. What side effects may I notice from receiving this medicine? Side effects that you should report to your doctor or health care professional as soon as possible: -allergic reactions like skin rash, itching or hives, swelling of the face, lips, or tongue -blurred vision -fainting spells -hallucinations -nausea or vomiting -nervousness -redness, blistering, peeling or loosening of the skin, including inside the mouth -slow or irregular heartbeat, palpitations, or chest pain -yellowing of the skin or eyes Side effects that usually do not require medical attention (report to your doctor or health care professional if they continue or are bothersome): -dizziness -drowsiness -dry mouth -tiredness or weakness This list may not describe all possible side effects. Call your doctor for medical advice about side effects. You may report side effects to FDA at 1-800-FDA-1088. Where should I keep my medicine? Keep out of the reach of children. Store at room temperature between 15 and 30 degrees C (59 and 86 degrees F). Throw away any unused medicine after the expiration date. NOTE: This sheet is a summary. It may not cover all possible information. If you have questions about this medicine, talk to your doctor, pharmacist, or health care provider.    2016, [employer]vier/[employer] Standard. (2008-02-27 12:38:02) Low Back Sprain With Rehab A sprain is an injury in which a ligament is torn. The ligaments of the lower back are vulnerable to sprains. However, they are strong and require great force to be injured. These ligaments are important for stabilizing the spinal column. Sprains are classified into three categories. Grade 1 sprains cause pain, but the tendon is not lengthened. Grade 2 sprains include a lengthened ligament, due to the  ligament being stretched or partially ruptured. With grade 2 sprains there is still function, although the function may be decreased. Grade 3 sprains involve a complete tear of the tendon or muscle, and function is usually impaired. SYMPTOMS   Severe pain in the lower back.  Sometimes, a feeling of a "pop," "snap," or tear, at the time of injury.  Tenderness and sometimes swelling at the injury site.  Uncommonly, bruising (contusion) within 48 hours of injury.  Muscle spasms in the back. CAUSES  Low back sprains occur when a force is placed on the ligaments that is greater than they can handle. Common causes of injury include:  Performing a stressful act while off-balance.  Repetitive stressful activities that involve movement of the lower back.  Direct hit (trauma) to the lower back. RISK INCREASES WITH:  Contact sports (football, wrestling).  Collisions (major skiing accidents).  Sports that require throwing or lifting (baseball, weightlifting).  Sports involving twisting of the spine (gymnastics, diving, tennis, golf).  Poor strength and flexibility.  Inadequate protection.  Previous back injury or surgery (especially fusion). PREVENTION  Wear properly fitted and padded protective equipment.  Warm up and stretch properly before activity.  Allow for adequate recovery between workouts.  Maintain physical fitness:  Strength, flexibility, and endurance.  Cardiovascular fitness.  Maintain a healthy body weight. PROGNOSIS  If treated properly, low back sprains usually heal with non-surgical treatment. The length of time for healing depends on the severity of the injury.  RELATED  COMPLICATIONS   Recurring symptoms, resulting in a chronic problem.  Chronic inflammation and pain in the low back.  Delayed healing or resolution of symptoms, especially if activity is resumed too soon.  Prolonged impairment.  Unstable or arthritic joints of the low back. TREATMENT   Treatment first involves the use of ice and medicine, to reduce pain and inflammation. The use of strengthening and stretching exercises may help reduce pain with activity. These exercises may be performed at home or with a therapist. Severe injuries may require referral to a therapist for further evaluation and treatment, such as ultrasound. Your caregiver may advise that you wear a back brace or corset, to help reduce pain and discomfort. Often, prolonged bed rest results in greater harm then benefit. Corticosteroid injections may be recommended. However, these should be reserved for the most serious cases. It is important to avoid using your back when lifting objects. At night, sleep on your back on a firm mattress, with a pillow placed under your knees. If non-surgical treatment is unsuccessful, surgery may be needed.  MEDICATION   If pain medicine is needed, nonsteroidal anti-inflammatory medicines (aspirin and ibuprofen), or other minor pain relievers (acetaminophen), are often advised.  Do not take pain medicine for 7 days before surgery.  Prescription pain relievers may be given, if your caregiver thinks they are needed. Use only as directed and only as much as you need.  Ointments applied to the skin may be helpful.  Corticosteroid injections may be given by your caregiver. These injections should be reserved for the most serious cases, because they may only be given a certain number of times. HEAT AND COLD  Cold treatment (icing) should be applied for 10 to 15 minutes every 2 to 3 hours for inflammation and pain, and immediately after activity that aggravates your symptoms. Use ice packs or an ice massage.  Heat treatment may be used before performing stretching and strengthening activities prescribed by your caregiver, physical therapist, or athletic trainer. Use a heat pack or a warm water soak. SEEK MEDICAL CARE IF:   Symptoms get worse or do not improve in 2 to 4 weeks, despite  treatment.  You develop numbness or weakness in either leg.  You lose bowel or bladder function.  Any of the following occur after surgery: fever, increased pain, swelling, redness, drainage of fluids, or bleeding in the affected area.  New, unexplained symptoms develop. (Drugs used in treatment may produce side effects.) EXERCISES  RANGE OF MOTION (ROM) AND STRETCHING EXERCISES - Low Back Sprain Most people with lower back pain will find that their symptoms get worse with excessive bending forward (flexion) or arching at the lower back (extension). The exercises that will help resolve your symptoms will focus on the opposite motion.  Your physician, physical therapist or athletic trainer will help you determine which exercises will be most helpful to resolve your lower back pain. Do not complete any exercises without first consulting with your caregiver. Discontinue any exercises which make your symptoms worse, until you speak to your caregiver. If you have pain, numbness or tingling which travels down into your buttocks, leg or foot, the goal of the therapy is for these symptoms to move closer to your back and eventually resolve. Sometimes, these leg symptoms will get better, but your lower back pain may worsen. This is often an indication of progress in your rehabilitation. Be very alert to any changes in your symptoms and the activities in which you participated in the 24  hours prior to the change. Sharing this information with your caregiver will allow him or her to most efficiently treat your condition. These exercises may help you when beginning to rehabilitate your injury. Your symptoms may resolve with or without further involvement from your physician, physical therapist or athletic trainer. While completing these exercises, remember:   Restoring tissue flexibility helps normal motion to return to the joints. This allows healthier, less painful movement and activity.  An effective stretch  should be held for at least 30 seconds.  A stretch should never be painful. You should only feel a gentle lengthening or release in the stretched tissue. FLEXION RANGE OF MOTION AND STRETCHING EXERCISES: STRETCH - Flexion, Single Knee to Chest   Lie on a firm bed or floor with both legs extended in front of you.  Keeping one leg in contact with the floor, bring your opposite knee to your chest. Hold your leg in place by either grabbing behind your thigh or at your knee.  Pull until you feel a gentle stretch in your low back. Hold __________ seconds.  Slowly release your grasp and repeat the exercise with the opposite side. Repeat __________ times. Complete this exercise __________ times per day.  STRETCH - Flexion, Double Knee to Chest  Lie on a firm bed or floor with both legs extended in front of you.  Keeping one leg in contact with the floor, bring your opposite knee to your chest.  Tense your stomach muscles to support your back and then lift your other knee to your chest. Hold your legs in place by either grabbing behind your thighs or at your knees.  Pull both knees toward your chest until you feel a gentle stretch in your low back. Hold __________ seconds.  Tense your stomach muscles and slowly return one leg at a time to the floor. Repeat __________ times. Complete this exercise __________ times per day.  STRETCH - Low Trunk Rotation  Lie on a firm bed or floor. Keeping your legs in front of you, bend your knees so they are both pointed toward the ceiling and your feet are flat on the floor.  Extend your arms out to the side. This will stabilize your upper body by keeping your shoulders in contact with the floor.  Gently and slowly drop both knees together to one side until you feel a gentle stretch in your low back. Hold for __________ seconds.  Tense your stomach muscles to support your lower back as you bring your knees back to the starting position. Repeat the exercise  to the other side. Repeat __________ times. Complete this exercise __________ times per day  EXTENSION RANGE OF MOTION AND FLEXIBILITY EXERCISES: STRETCH - Extension, Prone on Elbows   Lie on your stomach on the floor, a bed will be too soft. Place your palms about shoulder width apart and at the height of your head.  Place your elbows under your shoulders. If this is too painful, stack pillows under your chest.  Allow your body to relax so that your hips drop lower and make contact more completely with the floor.  Hold this position for __________ seconds.  Slowly return to lying flat on the floor. Repeat __________ times. Complete this exercise __________ times per day.  RANGE OF MOTION - Extension, Prone Press Ups  Lie on your stomach on the floor, a bed will be too soft. Place your palms about shoulder width apart and at the height of your head.  Keeping your  back as relaxed as possible, slowly straighten your elbows while keeping your hips on the floor. You may adjust the placement of your hands to maximize your comfort. As you gain motion, your hands will come more underneath your shoulders.  Hold this position __________ seconds.  Slowly return to lying flat on the floor. Repeat __________ times. Complete this exercise __________ times per day.  RANGE OF MOTION- Quadruped, Neutral Spine   Assume a hands and knees position on a firm surface. Keep your hands under your shoulders and your knees under your hips. You may place padding under your knees for comfort.  Drop your head and point your tailbone toward the ground below you. This will round out your lower back like an angry cat. Hold this position for __________ seconds.  Slowly lift your head and release your tail bone so that your back sags into a large arch, like an old horse.  Hold this position for __________ seconds.  Repeat this until you feel limber in your low back.  Now, find your "sweet spot." This will be the  most comfortable position somewhere between the two previous positions. This is your neutral spine. Once you have found this position, tense your stomach muscles to support your low back.  Hold this position for __________ seconds. Repeat __________ times. Complete this exercise __________ times per day.  STRENGTHENING EXERCISES - Low Back Sprain These exercises may help you when beginning to rehabilitate your injury. These exercises should be done near your "sweet spot." This is the neutral, low-back arch, somewhere between fully rounded and fully arched, that is your least painful position. When performed in this safe range of motion, these exercises can be used for people who have either a flexion or extension based injury. These exercises may resolve your symptoms with or without further involvement from your physician, physical therapist or athletic trainer. While completing these exercises, remember:   Muscles can gain both the endurance and the strength needed for everyday activities through controlled exercises.  Complete these exercises as instructed by your physician, physical therapist or athletic trainer. Increase the resistance and repetitions only as guided.  You may experience muscle soreness or fatigue, but the pain or discomfort you are trying to eliminate should never worsen during these exercises. If this pain does worsen, stop and make certain you are following the directions exactly. If the pain is still present after adjustments, discontinue the exercise until you can discuss the trouble with your caregiver. STRENGTHENING - Deep Abdominals, Pelvic Tilt   Lie on a firm bed or floor. Keeping your legs in front of you, bend your knees so they are both pointed toward the ceiling and your feet are flat on the floor.  Tense your lower abdominal muscles to press your low back into the floor. This motion will rotate your pelvis so that your tail bone is scooping upwards rather than  pointing at your feet or into the floor. With a gentle tension and even breathing, hold this position for __________ seconds. Repeat __________ times. Complete this exercise __________ times per day.  STRENGTHENING - Abdominals, Crunches   Lie on a firm bed or floor. Keeping your legs in front of you, bend your knees so they are both pointed toward the ceiling and your feet are flat on the floor. Cross your arms over your chest.  Slightly tip your chin down without bending your neck.  Tense your abdominals and slowly lift your trunk high enough to just clear your  shoulder blades. Lifting higher can put excessive stress on the lower back and does not further strengthen your abdominal muscles.  Control your return to the starting position. Repeat __________ times. Complete this exercise __________ times per day.  STRENGTHENING - Quadruped, Opposite UE/LE Lift   Assume a hands and knees position on a firm surface. Keep your hands under your shoulders and your knees under your hips. You may place padding under your knees for comfort.  Find your neutral spine and gently tense your abdominal muscles so that you can maintain this position. Your shoulders and hips should form a rectangle that is parallel with the floor and is not twisted.  Keeping your trunk steady, lift your right hand no higher than your shoulder and then your left leg no higher than your hip. Make sure you are not holding your breath. Hold this position for __________ seconds.  Continuing to keep your abdominal muscles tense and your back steady, slowly return to your starting position. Repeat with the opposite arm and leg. Repeat __________ times. Complete this exercise __________ times per day.  STRENGTHENING - Abdominals and Quadriceps, Straight Leg Raise   Lie on a firm bed or floor with both legs extended in front of you.  Keeping one leg in contact with the floor, bend the other knee so that your foot can rest flat on the  floor.  Find your neutral spine, and tense your abdominal muscles to maintain your spinal position throughout the exercise.  Slowly lift your straight leg off the floor about 6 inches for a count of 15, making sure to not hold your breath.  Still keeping your neutral spine, slowly lower your leg all the way to the floor. Repeat this exercise with each leg __________ times. Complete this exercise __________ times per day. POSTURE AND BODY MECHANICS CONSIDERATIONS - Low Back Sprain Keeping correct posture when sitting, standing or completing your activities will reduce the stress put on different body tissues, allowing injured tissues a chance to heal and limiting painful experiences. The following are general guidelines for improved posture. Your physician or physical therapist will provide you with any instructions specific to your needs. While reading these guidelines, remember:  The exercises prescribed by your provider will help you have the flexibility and strength to maintain correct postures.  The correct posture provides the best environment for your joints to work. All of your joints have less wear and tear when properly supported by a spine with good posture. This means you will experience a healthier, less painful body.  Correct posture must be practiced with all of your activities, especially prolonged sitting and standing. Correct posture is as important when doing repetitive low-stress activities (typing) as it is when doing a single heavy-load activity (lifting). RESTING POSITIONS Consider which positions are most painful for you when choosing a resting position. If you have pain with flexion-based activities (sitting, bending, stooping, squatting), choose a position that allows you to rest in a less flexed posture. You would want to avoid curling into a fetal position on your side. If your pain worsens with extension-based activities (prolonged standing, working overhead), avoid  resting in an extended position such as sleeping on your stomach. Most people will find more comfort when they rest with their spine in a more neutral position, neither too rounded nor too arched. Lying on a non-sagging bed on your side with a pillow between your knees, or on your back with a pillow under your knees will often provide  some relief. Keep in mind, being in any one position for a prolonged period of time, no matter how correct your posture, can still lead to stiffness. PROPER SITTING POSTURE In order to minimize stress and discomfort on your spine, you must sit with correct posture. Sitting with good posture should be effortless for a healthy body. Returning to good posture is a gradual process. Many people can work toward this most comfortably by using various supports until they have the flexibility and strength to maintain this posture on their own. When sitting with proper posture, your ears will fall over your shoulders and your shoulders will fall over your hips. You should use the back of the chair to support your upper back. Your lower back will be in a neutral position, just slightly arched. You may place a small pillow or folded towel at the base of your lower back for  support.  When working at a desk, create an environment that supports good, upright posture. Without extra support, muscles tire, which leads to excessive strain on joints and other tissues. Keep these recommendations in mind: CHAIR:  A chair should be able to slide under your desk when your back makes contact with the back of the chair. This allows you to work closely.  The chair's height should allow your eyes to be level with the upper part of your monitor and your hands to be slightly lower than your elbows. BODY POSITION  Your feet should make contact with the floor. If this is not possible, use a foot rest.  Keep your ears over your shoulders. This will reduce stress on your neck and low back. INCORRECT  SITTING POSTURES  If you are feeling tired and unable to assume a healthy sitting posture, do not slouch or slump. This puts excessive strain on your back tissues, causing more damage and pain. Healthier options include:  Using more support, like a lumbar pillow.  Switching tasks to something that requires you to be upright or walking.  Talking a brief walk.  Lying down to rest in a neutral-spine position. PROLONGED STANDING WHILE SLIGHTLY LEANING FORWARD  When completing a task that requires you to lean forward while standing in one place for a long time, place either foot up on a stationary 2-4 inch high object to help maintain the best posture. When both feet are on the ground, the lower back tends to lose its slight inward curve. If this curve flattens (or becomes too large), then the back and your other joints will experience too much stress, tire more quickly, and can cause pain. CORRECT STANDING POSTURES Proper standing posture should be assumed with all daily activities, even if they only take a few moments, like when brushing your teeth. As in sitting, your ears should fall over your shoulders and your shoulders should fall over your hips. You should keep a slight tension in your abdominal muscles to brace your spine. Your tailbone should point down to the ground, not behind your body, resulting in an over-extended swayback posture.  INCORRECT STANDING POSTURES  Common incorrect standing postures include a forward head, locked knees and/or an excessive swayback. WALKING Walk with an upright posture. Your ears, shoulders and hips should all line-up. PROLONGED ACTIVITY IN A FLEXED POSITION When completing a task that requires you to bend forward at your waist or lean over a low surface, try to find a way to stabilize 3 out of 4 of your limbs. You can place a hand or elbow on  your thigh or rest a knee on the surface you are reaching across. This will provide you more stability, so that your  muscles do not tire as quickly. By keeping your knees relaxed, or slightly bent, you will also reduce stress across your lower back. CORRECT LIFTING TECHNIQUES DO :  Assume a wide stance. This will provide you more stability and the opportunity to get as close as possible to the object which you are lifting.  Tense your abdominals to brace your spine. Bend at the knees and hips. Keeping your back locked in a neutral-spine position, lift using your leg muscles. Lift with your legs, keeping your back straight.  Test the weight of unknown objects before attempting to lift them.  Try to keep your elbows locked down at your sides in order get the best strength from your shoulders when carrying an object.  Always ask for help when lifting heavy or awkward objects. INCORRECT LIFTING TECHNIQUES DO NOT:   Lock your knees when lifting, even if it is a small object.  Bend and twist. Pivot at your feet or move your feet when needing to change directions.  Assume that you can safely pick up even a paperclip without proper posture.   This information is not intended to replace advice given to you by your health care provider. Make sure you discuss any questions you have with your health care provider.   Document Released: 06/12/2005 Document Revised: 07/03/2014 Document Reviewed: 09/24/2008 Elsevier Interactive Patient Education [employer]
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
## 2015-12-03 ENCOUNTER — Encounter: Payer: Self-pay | Admitting: *Deleted

## 2015-12-03 ENCOUNTER — Telehealth: Payer: Self-pay

## 2015-12-03 ENCOUNTER — Ambulatory Visit (INDEPENDENT_AMBULATORY_CARE_PROVIDER_SITE_OTHER): Payer: Worker's Compensation | Admitting: Physician Assistant

## 2015-12-03 VITALS — BP 128/80 | HR 87 | Temp 98.0°F | Resp 17 | Ht 67.5 in | Wt 204.0 lb

## 2015-12-03 DIAGNOSIS — M545 Low back pain, unspecified: Secondary | ICD-10-CM

## 2015-12-03 DIAGNOSIS — Z026 Encounter for examination for insurance purposes: Secondary | ICD-10-CM

## 2015-12-03 NOTE — Telephone Encounter (Signed)
Patient needs his OOW note to include today.  He is on his way back to the Clinic now.

## 2015-12-03 NOTE — Telephone Encounter (Signed)
Ok saved in his chart, please print.

## 2015-12-03 NOTE — Progress Notes (Signed)
   12/03/2015 12:15 PM   DOB: [birth year] / MRN: [id number]  SUBJECTIVE:  Jason [name] is a 45 y.o. male presenting for a recheck of acute on chronic low back pain.  He was initially evaluated on 11/25/15 by me.    Today he report feeling mildly improved.  He was seen by Dr. Harrison MonsBlake here who did advise that he return to work however he was unable to go in due to the pain and needs a note saying that he is excused on 6/6, 6/7, 6/8.  He feels that he is getting better.     Review of Systems  Constitutional: Negative for fever and chills.  Respiratory: Negative for cough.   Gastrointestinal: Negative for nausea.  Genitourinary: Negative for dysuria.  Skin: Negative for rash.  Neurological: Negative for dizziness and headaches.    Problem list and medications reviewed and updated by myself where necessary, and exist elsewhere in the encounter.   OBJECTIVE:  BP 128/80 mmHg  Pulse 87  Temp(Src) 98 F (36.7 C) (Oral)  Resp 17  Ht 5' 7.5" (1.715 m)  Wt 204 lb (92.534 kg)  BMI 31.46 kg/m2  SpO2 95%  Physical Exam  Constitutional: He is oriented to person, place, and time. He appears well-developed and well-nourished.  Cardiovascular: Normal rate and regular rhythm.   Pulmonary/Chest: Effort normal and breath sounds normal.  Neurological: He is alert and oriented to person, place, and time. He has normal strength and normal reflexes. No cranial nerve deficit or sensory deficit. He displays a negative Romberg sign. Gait normal. GCS eye subscore is 4. GCS verbal subscore is 5. GCS motor subscore is 6.    No results found for this or any previous visit (from the past 72 hour(s)).  No results found.  ASSESSMENT AND PLAN  Roger ShelterGordon was seen today for follow-up.  Diagnoses and all orders for this visit:  Bilateral low back pain without sciatica: Will excuse him from the 6th, 7th and 8th.  Advised that he go back on Monday with work restrictions.  He agrees. Will see him back in 2 weeks for  clearance.   Encounter related to worker's compensation claim    The patient was advised to call or return to clinic if he does not see an improvement in symptoms or to seek the care of the closest emergency department if he worsens with the above plan.   Deliah BostonMichael Lamija Besse, MHS, PA-C [facility] 12/03/2015 12:15 PM

## 2015-12-19 ENCOUNTER — Other Ambulatory Visit: Payer: Self-pay | Admitting: Family

## 2016-01-17 ENCOUNTER — Telehealth: Payer: Self-pay | Admitting: Emergency Medicine

## 2016-01-17 DIAGNOSIS — G4700 Insomnia, unspecified: Secondary | ICD-10-CM

## 2016-01-17 MED ORDER — GABAPENTIN 100 MG PO CAPS: 200.0000 mg | ORAL_CAPSULE | Freq: Two times a day (BID) | ORAL | 2 refills | Status: DC

## 2016-01-17 MED ORDER — ESCITALOPRAM OXALATE 20 MG PO TABS: 20.0000 mg | ORAL_TABLET | Freq: Every day | ORAL | 2 refills | Status: DC

## 2016-01-17 MED ORDER — ZOLPIDEM TARTRATE 10 MG PO TABS: 10.0000 mg | ORAL_TABLET | Freq: Every evening | ORAL | 1 refills | Status: DC | PRN

## 2016-01-17 NOTE — Telephone Encounter (Signed)
Pt called and needs a prescription refill on zolpidem (AMBIEN) 10 MG tablet, gabapentin (NEURONTIN) 100 MG capsule and escitalopram (LEXAPRO) 20 MG tablet. Pharmacy is [facility]. Please follow up thanks.

## 2016-01-17 NOTE — Telephone Encounter (Signed)
Medications refilled

## 2016-01-17 NOTE — Telephone Encounter (Signed)
Please advise on med refills  Last refills for both gabapentin and ambien was in may.

## 2016-01-18 NOTE — Telephone Encounter (Signed)
Pt aware.

## 2016-01-24 ENCOUNTER — Institutional Professional Consult (permissible substitution): Payer: Self-pay | Admitting: Pulmonary Disease

## 2016-01-25 ENCOUNTER — Encounter: Payer: Self-pay | Admitting: Family

## 2016-01-25 ENCOUNTER — Ambulatory Visit (INDEPENDENT_AMBULATORY_CARE_PROVIDER_SITE_OTHER): Payer: BC Managed Care – PPO | Admitting: Family

## 2016-01-25 DIAGNOSIS — B349 Viral infection, unspecified: Secondary | ICD-10-CM | POA: Diagnosis not present

## 2016-01-25 NOTE — Patient Instructions (Signed)
Thank you for choosing [facility].  Summary/Instructions:  Please continue to take her medications as prescribed.  Use over-the-counter medications as needed for symptom relief and supportive care.  Your prescription(s) have been submitted to your pharmacy or been printed and provided for you. Please take as directed and contact our office if you believe you are having problem(s) with the medication(s) or have any questions.  If your symptoms worsen or fail to improve, please contact our office for further instruction, or in case of emergency go directly to the emergency room at the closest medical facility.   General Recommendations:    Please drink plenty of fluids.  Get plenty of rest   Sleep in humidified air  Use saline nasal sprays  Netti pot   OTC Medications:  Decongestants - helps relieve congestion   Flonase (generic fluticasone) or Nasacort (generic triamcinolone) - please make sure to use the "cross-over" technique at a 45 degree angle towards the opposite eye as opposed to straight up the nasal passageway.   Sudafed (generic pseudoephedrine - Note this is the one that is available behind the pharmacy counter); Products with phenylephrine (-PE) may also be used but is often not as effective as pseudoephedrine.   If you have HIGH BLOOD PRESSURE - Coricidin HBP; AVOID any product that is -D as this contains pseudoephedrine which may increase your blood pressure.  Afrin (oxymetazoline) every 6-8 hours for up to 3 days.   Allergies - helps relieve runny nose, itchy eyes and sneezing   Claritin (generic loratidine), Allegra (fexofenidine), or Zyrtec (generic cyrterizine) for runny nose. These medications should not cause drowsiness.  Note - Benadryl (generic diphenhydramine) may be used however may cause drowsiness  Cough -   Delsym or Robitussin (generic dextromethorphan)  Expectorants - helps loosen mucus to ease removal   Mucinex (generic  guaifenesin) as directed on the package.  Headaches / General Aches   Tylenol (generic acetaminophen) - DO NOT EXCEED 3 grams (3,000 mg) in a 24 hour time period  Advil/Motrin (generic ibuprofen)   Sore Throat -   Salt water gargle   Chloraseptic (generic benzocaine) spray or lozenges / Sucrets (generic dyclonine)

## 2016-01-25 NOTE — Assessment & Plan Note (Signed)
Symptoms and exam consistent with viral infection that appears to be resolving although cannot rule out bacterial infection secondary to antibiotics patient took independently from a previous prescription that he had. No further fevers are noted. Physical exam is otherwise benign. Encouraged to continue over-the-counter medications as needed for symptom relief and supportive care. Follow-up if symptoms worsen or do not improve.

## 2016-01-25 NOTE — Progress Notes (Signed)
Subjective:    Patient ID: Jason [name], male    DOB: 05-Sep-1970, 45 y.o.   MRN: [id number]  Chief Complaint  Patient presents with  . Fatigue    high fever of 103 on friday and saturday last week and now just feels really tired, needs work note for thursday, friday, sunday and monday    HPI:  Jason [name] is a 45 y.o. male who  has a past medical history of Anxiety; Back pain; Chicken pox; Depression; Diabetes mellitus without complication (HCC); Hyperlipidemia; Hypertension; Kidney stones; and Ulceration of colon. and presents today For an acute office visit.  This is a new problem. Associated symptoms of fever and fatigue have been going on for approximately 4-5 days with no other significant symptoms. The last fever was about 24 hours ago. The temperature maximum was 103 which was on Saturday. Modifying factors include previous prescribed antibiotics which seemed to help some. No fevers today with overall improvement.    Allergies  Allergen Reactions  . Ibuprofen Hives  . Lactose Intolerance (Gi) Other (See Comments)    Gi Upset  . Tape Itching, Rash and Other (See Comments)    Paper tape     Current Outpatient Prescriptions on File Prior to Visit  Medication Sig Dispense Refill  . carisoprodol (SOMA) 350 MG tablet Take 1 tablet (350 mg total) by mouth 3 (three) times daily. 90 tablet 0  . clonazePAM (KLONOPIN) 1 MG tablet TAKE ONE TABLET BY MOUTH ONCE DAILY AS NEEDED FOR ANXIETY 30 tablet 2  . escitalopram (LEXAPRO) 20 MG tablet Take 1 tablet (20 mg total) by mouth daily. 30 tablet 2  . gabapentin (NEURONTIN) 100 MG capsule Take 2 capsules (200 mg total) by mouth 2 (two) times daily. 120 capsule 2  . glyBURIDE (DIABETA) 5 MG tablet Take 1 tablet (5 mg total) by mouth 2 (two) times daily. 90 tablet 0  . HYDROcodone-acetaminophen (NORCO/VICODIN) 5-325 MG tablet Take 1 tablet by mouth every 8 (eight) hours as needed for moderate pain. 90 tablet 0  . metFORMIN (GLUCOPHAGE) 1000 MG  tablet Take 1 tablet (1,000 mg total) by mouth 2 (two) times daily with a meal. 60 tablet 3  . omeprazole (PRILOSEC) 20 MG capsule Take 1 capsule (20 mg total) by mouth daily. 90 capsule 1  . QUEtiapine (SEROQUEL) 50 MG tablet TAKE ONE TABLET BY MOUTH AT BEDTIME 30 tablet 3  . tiZANidine (ZANAFLEX) 4 MG tablet Take 1 tablet (4 mg total) by mouth every 8 (eight) hours as needed for muscle spasms. 30 tablet 0  . valsartan (DIOVAN) 160 MG tablet Take 1 tablet (160 mg total) by mouth daily. 90 tablet 1  . zolpidem (AMBIEN) 10 MG tablet Take 1 tablet (10 mg total) by mouth at bedtime as needed for sleep. 30 tablet 1   No current facility-administered medications on file prior to visit.     Review of Systems  Constitutional: Positive for fever.  HENT: Positive for congestion. Negative for ear pain, sinus pressure and sore throat.   Respiratory: Negative for cough, chest tightness and shortness of breath.   Gastrointestinal: Negative for abdominal pain.  Neurological: Negative for headaches.      Objective:    BP (!) 142/102 (BP Location: Left Arm, Patient Position: Sitting, Cuff Size: Normal)   Pulse (!) 116   Temp 97.9 F (36.6 C) (Oral)   Resp 16   Ht  (1.702 m)   Wt 205 lb (93 kg)   SpO2 92%  BMI 32.11 kg/m  Nursing note and vital signs reviewed.  Physical Exam  Constitutional: He is oriented to person, place, and time. He appears well-developed and well-nourished. No distress.  HENT:  Right Ear: Hearing, tympanic membrane, external ear and ear canal normal.  Left Ear: Hearing, tympanic membrane, external ear and ear canal normal.  Nose: Nose normal.  Mouth/Throat: Uvula is midline, oropharynx is clear and moist and mucous membranes are normal.  Neck: Neck supple.  Cardiovascular: Normal rate, regular rhythm, normal heart sounds and intact distal pulses.   Pulmonary/Chest: Effort normal and breath sounds normal. No respiratory distress. He has no wheezes. He has no rales.  He exhibits no tenderness.  Lymphadenopathy:    He has no cervical adenopathy.  Neurological: He is alert and oriented to person, place, and time.  Skin: Skin is warm and dry.  Psychiatric: He has a normal mood and affect. His behavior is normal. Judgment and thought content normal.       Assessment & Plan:   Problem List Items Addressed This Visit      Other   Viral infection    Symptoms and exam consistent with viral infection that appears to be resolving although cannot rule out bacterial infection secondary to antibiotics patient took independently from a previous prescription that he had. No further fevers are noted. Physical exam is otherwise benign. Encouraged to continue over-the-counter medications as needed for symptom relief and supportive care. Follow-up if symptoms worsen or do not improve.       Other Visit Diagnoses   None.      I am having Mr. Beckett maintain his valsartan, metFORMIN, HYDROcodone-acetaminophen, carisoprodol, glyBURIDE, omeprazole, clonazePAM, tiZANidine, QUEtiapine, zolpidem, gabapentin, and escitalopram.   Follow-up: Return if symptoms worsen or fail to improve.  Jeanine Luz, FNP

## 2016-02-14 ENCOUNTER — Telehealth: Payer: Self-pay | Admitting: Family

## 2016-02-14 MED ORDER — CLONAZEPAM 1 MG PO TABS: ORAL_TABLET | ORAL | 1 refills | Status: DC

## 2016-02-14 NOTE — Telephone Encounter (Signed)
Pt request refill for clonazePAM (KLONOPIN) 1 MG . Pls call him once its done.

## 2016-02-14 NOTE — Telephone Encounter (Signed)
Last refill was 11/24/15

## 2016-02-14 NOTE — Telephone Encounter (Signed)
Medication refilled

## 2016-02-14 NOTE — Telephone Encounter (Signed)
Rx has been faxed.

## 2016-03-09 ENCOUNTER — Telehealth: Payer: Self-pay | Admitting: *Deleted

## 2016-03-09 DIAGNOSIS — G4700 Insomnia, unspecified: Secondary | ICD-10-CM

## 2016-03-09 NOTE — Telephone Encounter (Signed)
Rec'd call pt requesting refills on Zolpidem...Raechel Chute/lmb

## 2016-03-13 MED ORDER — ZOLPIDEM TARTRATE 10 MG PO TABS: 10.0000 mg | ORAL_TABLET | Freq: Every evening | ORAL | 1 refills | Status: DC | PRN

## 2016-03-13 NOTE — Telephone Encounter (Signed)
Pt calling again about the refill on this med?

## 2016-03-13 NOTE — Telephone Encounter (Signed)
Medication faxed

## 2016-03-13 NOTE — Telephone Encounter (Signed)
Medication refilled

## 2016-04-09 ENCOUNTER — Other Ambulatory Visit: Payer: Self-pay | Admitting: Family

## 2016-04-10 NOTE — Telephone Encounter (Signed)
Pt left msg on triage requesting refill on his clonazepam.../lmb 

## 2016-04-10 NOTE — Telephone Encounter (Signed)
Rx faxed back to [facility].../lmb 

## 2016-04-10 NOTE — Telephone Encounter (Signed)
Last refill was 02/14/16

## 2016-04-14 ENCOUNTER — Telehealth: Payer: Self-pay | Admitting: Family

## 2016-04-14 NOTE — Telephone Encounter (Signed)
Called clonazepam into [facility] had to leave on pharmacist vm...Raechel Chute/lmb

## 2016-04-14 NOTE — Telephone Encounter (Signed)
Please refax

## 2016-04-14 NOTE — Telephone Encounter (Signed)
Patient requesting refill on klonopin

## 2016-04-17 ENCOUNTER — Telehealth: Payer: Self-pay | Admitting: Emergency Medicine

## 2016-04-17 NOTE — Telephone Encounter (Signed)
Pt is calling again about this. Please follow up, Thank you.
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
## 2016-04-22 ENCOUNTER — Other Ambulatory Visit: Payer: Self-pay | Admitting: Family

## 2016-04-25 ENCOUNTER — Other Ambulatory Visit: Payer: Self-pay | Admitting: Family

## 2016-05-10 ENCOUNTER — Telehealth: Payer: Self-pay | Admitting: Family

## 2016-05-10 DIAGNOSIS — F5101 Primary insomnia: Secondary | ICD-10-CM

## 2016-05-10 NOTE — Telephone Encounter (Signed)
zolpidem (AMBIEN) 10 MG tablet [  Patient is requesting a refill on this medication. Please follow up, Thank you.

## 2016-05-11 ENCOUNTER — Other Ambulatory Visit: Payer: Self-pay | Admitting: Family

## 2016-05-11 DIAGNOSIS — F5101 Primary insomnia: Secondary | ICD-10-CM

## 2016-05-11 MED ORDER — ZOLPIDEM TARTRATE 10 MG PO TABS: 10.0000 mg | ORAL_TABLET | Freq: Every evening | ORAL | 1 refills | Status: DC | PRN

## 2016-05-11 NOTE — Telephone Encounter (Signed)
Notified patient.

## 2016-05-11 NOTE — Telephone Encounter (Signed)
Patient has called to inquire on this

## 2016-05-11 NOTE — Telephone Encounter (Signed)
Medication refilled

## 2016-06-06 ENCOUNTER — Other Ambulatory Visit: Payer: Self-pay | Admitting: Family

## 2016-06-06 NOTE — Telephone Encounter (Signed)
Last refill was 04/10/16 with 1 refill 

## 2016-06-09 NOTE — Telephone Encounter (Signed)
Faxed

## 2016-07-05 ENCOUNTER — Other Ambulatory Visit: Payer: Self-pay | Admitting: Family

## 2016-07-05 DIAGNOSIS — F5101 Primary insomnia: Secondary | ICD-10-CM

## 2016-07-05 NOTE — Telephone Encounter (Signed)
Last refill was 05/11/16

## 2016-07-06 ENCOUNTER — Other Ambulatory Visit: Payer: Self-pay | Admitting: Family

## 2016-07-06 DIAGNOSIS — F5101 Primary insomnia: Secondary | ICD-10-CM

## 2016-07-06 NOTE — Telephone Encounter (Signed)
Script faxed back to [facility].../lmb 

## 2016-07-17 ENCOUNTER — Other Ambulatory Visit: Payer: Self-pay | Admitting: Family

## 2016-07-17 NOTE — Telephone Encounter (Signed)
Last refill was 04/15/16

## 2016-07-31 ENCOUNTER — Other Ambulatory Visit: Payer: Self-pay | Admitting: Family

## 2016-08-01 NOTE — Telephone Encounter (Signed)
Routing to greg, please advise, thanks 

## 2016-08-02 NOTE — Telephone Encounter (Signed)
Faxed

## 2016-08-03 ENCOUNTER — Ambulatory Visit: Payer: Self-pay | Admitting: Nurse Practitioner

## 2016-08-14 ENCOUNTER — Other Ambulatory Visit: Payer: Self-pay | Admitting: Family

## 2016-08-14 DIAGNOSIS — I10 Essential (primary) hypertension: Secondary | ICD-10-CM

## 2016-08-14 DIAGNOSIS — IMO0002 Reserved for concepts with insufficient information to code with codable children: Secondary | ICD-10-CM

## 2016-08-14 DIAGNOSIS — E1165 Type 2 diabetes mellitus with hyperglycemia: Secondary | ICD-10-CM

## 2016-08-15 NOTE — Telephone Encounter (Signed)
Routing to greg, i'm not showing you as prescriber for valsartan----please advise if you are ok with refills, thanks

## 2016-08-23 ENCOUNTER — Other Ambulatory Visit: Payer: Self-pay | Admitting: Family

## 2016-08-23 DIAGNOSIS — F5101 Primary insomnia: Secondary | ICD-10-CM

## 2016-08-23 NOTE — Telephone Encounter (Signed)
Faxed

## 2016-08-23 NOTE — Telephone Encounter (Signed)
Last refill was 07/05/16 

## 2016-08-30 ENCOUNTER — Telehealth: Payer: Self-pay

## 2016-08-30 NOTE — Telephone Encounter (Signed)
PA started via covermymeds. Key: [id number]

## 2016-09-01 NOTE — Telephone Encounter (Signed)
Spoke with patient and informed.

## 2016-09-01 NOTE — Telephone Encounter (Signed)
PA approved.   Will you contact pt and have them call pharmacy when they are ready for refills.

## 2016-09-04 ENCOUNTER — Other Ambulatory Visit (INDEPENDENT_AMBULATORY_CARE_PROVIDER_SITE_OTHER): Payer: BC Managed Care – PPO

## 2016-09-04 ENCOUNTER — Encounter: Payer: Self-pay | Admitting: Family

## 2016-09-04 ENCOUNTER — Ambulatory Visit (INDEPENDENT_AMBULATORY_CARE_PROVIDER_SITE_OTHER): Payer: BC Managed Care – PPO | Admitting: Family

## 2016-09-04 VITALS — BP 144/94 | HR 108 | Temp 98.0°F | Resp 16 | Ht 67.0 in | Wt 190.0 lb

## 2016-09-04 DIAGNOSIS — E1165 Type 2 diabetes mellitus with hyperglycemia: Secondary | ICD-10-CM

## 2016-09-04 DIAGNOSIS — I10 Essential (primary) hypertension: Secondary | ICD-10-CM | POA: Diagnosis not present

## 2016-09-04 DIAGNOSIS — IMO0001 Reserved for inherently not codable concepts without codable children: Secondary | ICD-10-CM

## 2016-09-04 DIAGNOSIS — M940 Chondrocostal junction syndrome [Tietze]: Secondary | ICD-10-CM | POA: Diagnosis not present

## 2016-09-04 DIAGNOSIS — Z23 Encounter for immunization: Secondary | ICD-10-CM | POA: Diagnosis not present

## 2016-09-04 LAB — COMPREHENSIVE METABOLIC PANEL
ALBUMIN: 4.2 g/dL (ref 3.5–5.2)
ALT: 15 U/L (ref 0–53)
AST: 14 U/L (ref 0–37)
Alkaline Phosphatase: 91 U/L (ref 39–117)
BILIRUBIN TOTAL: 0.4 mg/dL (ref 0.2–1.2)
BUN: 8 mg/dL (ref 6–23)
CHLORIDE: 96 meq/L (ref 96–112)
CO2: 26 mEq/L (ref 19–32)
CREATININE: 0.78 mg/dL (ref 0.40–1.50)
Calcium: 9.6 mg/dL (ref 8.4–10.5)
GFR: 113.89 mL/min (ref >60.00)
Glucose, Bld: 432 mg/dL — ABNORMAL HIGH (ref 70–99)
Potassium: 3.5 mEq/L (ref 3.5–5.1)
SODIUM: 134 meq/L — AB (ref 135–145)
TOTAL PROTEIN: 7.6 g/dL (ref 6.0–8.3)

## 2016-09-04 LAB — MICROALBUMIN / CREATININE URINE RATIO
Creatinine,U: 61.9 mg/dL
Microalb Creat Ratio: 3.2 mg/g (ref 0.0–30.0)
Microalb, Ur: 2 mg/dL — ABNORMAL HIGH (ref 0.0–1.9)

## 2016-09-04 LAB — HEMOGLOBIN A1C: Hgb A1c MFr Bld: 12.1 % — ABNORMAL HIGH (ref 4.6–6.5)

## 2016-09-04 NOTE — Assessment & Plan Note (Signed)
Most recent A1c of 10.7 with patient indicating home blood sugars reading within normal ranges. Obtain hemoglobin A1c, urine microalbumin, and complete metabolic profile. Continue current dosage of metformin and glyburide pending blood work results. Diabetic foot exam completed today. Encouraged complete diabetic eye exam independently. Maintained on valsartan for CAD risk reduction. Declines Pneumovax. Encouraged to monitor blood sugars at home and follow carbohydrate modified type diet. Continue to monitor.

## 2016-09-04 NOTE — Progress Notes (Signed)
Subjective:    Patient ID: Jason [name] Age, male    DOB: 07-15-1970, 46 y.o.   MRN: [id number]  Chief Complaint  Patient presents with  . Back Pain    since last tuesday has been having a back pain in the left side of his back    HPI:  Jason [name] is a 46 y.o. male who  has a past medical history of Anxiety; Back pain; Chicken pox; Depression; Diabetes mellitus without complication (HCC); Hyperlipidemia; Hypertension; Kidney stones; and Ulceration of colon. and presents today for an acute office visit.  1.) Type 2 diabetes - Currently maintained on metformin and glyburide. Reports take the medication as prescribed and denies adverse side effects of hypoglycemic readings. Not currently checking his blood sugars at home, however when he has checked them he notes they were decreased. Has lost about 15 pounds since previous office visit. Denies symptoms of end organ damage or changes in vision. Not working on a low carbohydrate diet.   Lab Results  Component Value Date   HGBA1C 10.7 (H) 08/20/2015    2.) Hypertension - Maintained on valsartan. Reports taken medications as prescribed and denies adverse side effects. Does not currently check blood pressures at home. Denies worst headache of life with no new symptoms of end organ damage. Not following a low-sodium diet.  BP Readings from Last 3 Encounters:  09/04/16 (!) 144/94  01/25/16 (!) 142/102  12/03/15 128/80    3.) Flank / midback - This is a new problem. Associated symptom of pain located in the left flank and midback has been going on for about 1 week. Describes are described as dull and sharp. No trauma or injury that he can recall. Aggravated by movement and coughing. Has had recent episodes of coughing and sneezing. Does have some urinary urgency at times that is worse at night with no dysuria, frequency, fevers, or chills. Modifying factors include his hydrocodone-acetaminophen and soma which did help with his lower back, but not  with his mid-back.    Allergies  Allergen Reactions  . Ibuprofen Hives  . Lactose Intolerance (Gi) Other (See Comments)    Gi Upset  . Tape Itching, Rash and Other (See Comments)    Paper tape      Outpatient Medications Prior to Visit  Medication Sig Dispense Refill  . carisoprodol (SOMA) 350 MG tablet Take 1 tablet (350 mg total) by mouth 3 (three) times daily. 90 tablet 0  . clonazePAM (KLONOPIN) 1 MG tablet TAKE ONE TABLET BY MOUTH ONCE DAILY AS NEEDED FOR ANXIETY 30 tablet 1  . escitalopram (LEXAPRO) 20 MG tablet Take 1 tablet (20 mg total) by mouth daily. 30 tablet 2  . gabapentin (NEURONTIN) 100 MG capsule TAKE TWO CAPSULES BY MOUTH TWICE DAILY 120 capsule 2  . glyBURIDE (DIABETA) 5 MG tablet Take 1 tablet (5 mg total) by mouth 2 (two) times daily. 90 tablet 0  . HYDROcodone-acetaminophen (NORCO/VICODIN) 5-325 MG tablet Take 1 tablet by mouth every 8 (eight) hours as needed for moderate pain. 90 tablet 0  . metFORMIN (GLUCOPHAGE) 1000 MG tablet Take 1 tablet (1,000 mg total) by mouth 2 (two) times daily with a meal. 60 tablet 3  . omeprazole (PRILOSEC) 20 MG capsule Take 1 capsule (20 mg total) by mouth daily. 90 capsule 1  . QUEtiapine (SEROQUEL) 50 MG tablet TAKE ONE TABLET BY MOUTH AT BEDTIME 30 tablet 3  . tiZANidine (ZANAFLEX) 4 MG tablet Take 1 tablet (4 mg total) by mouth every  8 (eight) hours as needed for muscle spasms. 30 tablet 0  . valsartan (DIOVAN) 160 MG tablet TAKE ONE TABLET BY MOUTH ONCE DAILY 90 tablet 0  . zolpidem (AMBIEN) 10 MG tablet TAKE ONE TABLET BY MOUTH AT BEDTIME AS NEEDED FOR SLEEP 30 tablet 1   No facility-administered medications prior to visit.       Past Surgical History:  Procedure Laterality Date  . BACK SURGERY    . FRACTURE SURGERY     ankle, arm,   . HERNIA REPAIR    . SPINE SURGERY        Past Medical History:  Diagnosis Date  . Anxiety   . Back pain   . Chicken pox   . Depression   . Diabetes mellitus without  complication (HCC)   . Hyperlipidemia   . Hypertension   . Kidney stones   . Ulceration of colon       Review of Systems  Constitutional: Negative for chills and fever.  Eyes:       Negative for changes in vision  Respiratory: Negative for cough, chest tightness and wheezing.   Cardiovascular: Negative for chest pain, palpitations and leg swelling.  Neurological: Negative for dizziness, weakness and light-headedness.      Objective:    BP (!) 144/94 (BP Location: Left Arm, Patient Position: Sitting, Cuff Size: Normal)   Pulse (!) 108   Temp 98 F (36.7 C) (Oral)   Resp 16   Ht 5\' 7"  (1.702 m)   Wt 190 lb (86.2 kg)   SpO2 97%   BMI 29.76 kg/m  Nursing note and vital signs reviewed.  Physical Exam  Constitutional: He is oriented to person, place, and time. He appears well-developed and well-nourished. No distress.  Cardiovascular: Normal rate, regular rhythm, normal heart sounds and intact distal pulses.   Pulmonary/Chest: Effort normal and breath sounds normal. No respiratory distress. He has no wheezes. He has no rales. He exhibits no tenderness.  Chest wall/back - no obvious deformity, discoloration, or edema. Palpable tenderness along ribs 6 through 8 and thoracic paraspinal musculature. No crepitus or deformity. Positive compression test.  Neurological: He is alert and oriented to person, place, and time.  Diabetic foot exam - bilateral feet are free from skin breakdown, cuts, and abrasions. Toenails are neatly trimmed. Pulses are intact and appropriate. Sensation is intact to monofilament bilaterally.  Skin: Skin is warm and dry.  Psychiatric: He has a normal mood and affect. His behavior is normal. Judgment and thought content normal.       Assessment & Plan:   Problem List Items Addressed This Visit      Cardiovascular and Mediastinum   Essential hypertension    Blood pressure slightly elevated today above goal 140/90 with current medication regimen and no  adverse side effects. Denies worst headache of life with no new symptoms of end organ damage noted on physical exam. Continue current dosage of valsartan. Encouraged follow sodium diet and monitor blood pressure at home. He has positively lost 15 pounds since most recent office visit. Encouraged continued weight loss to help with symptoms. Continue to monitor.        Endocrine   Type 2 diabetes mellitus, uncontrolled (HCC)    Most recent A1c of 10.7 with patient indicating home blood sugars reading within normal ranges. Obtain hemoglobin A1c, urine microalbumin, and complete metabolic profile. Continue current dosage of metformin and glyburide pending blood work results. Diabetic foot exam completed today. Encouraged complete diabetic eye  exam independently. Maintained on valsartan for CAD risk reduction. Declines Pneumovax. Encouraged to monitor blood sugars at home and follow carbohydrate modified type diet. Continue to monitor.      Relevant Orders   Hemoglobin A1c   Urine Microalbumin w/creat. ratio   Comprehensive metabolic panel     Musculoskeletal and Integument   Costochondritis - Primary    Symptoms and exam consistent with costochondritis with no significant trauma most likely related to coughing/sneezing and most recent cold symptoms. Treat conservatively with Tylenol and compression as needed. Encourage deep breathing and coughing periodically. Follow-up if symptoms worsen or do not improve.       Other Visit Diagnoses    Encounter for immunization       Relevant Orders   Flu Vaccine QUAD 36+ mos IM (Completed)       I am having Jason [name] maintain his metFORMIN, HYDROcodone-acetaminophen, carisoprodol, glyBURIDE, omeprazole, tiZANidine, escitalopram, gabapentin, clonazePAM, QUEtiapine, valsartan, and zolpidem.   No orders of the defined types were placed in this encounter.    Follow-up: Return in about 3 months (around 12/05/2016), or if symptoms worsen or fail to  improve.  Jeanine Luzalone, Graclyn Lawther, FNP

## 2016-09-04 NOTE — Patient Instructions (Addendum)
Thank you for choosing [facility].  SUMMARY AND INSTRUCTIONS:  Compression as needed.  Continue to take deep breaths and coughing periodically to prevent pneumonia.   Medication:  Please continue to take your medications as prescribed.   Start Tylenol 650 mg 3-4x per day for the next 5-7 days.  Your prescription(s) have been submitted to your pharmacy or been printed and provided for you. Please take as directed and contact our office if you believe you are having problem(s) with the medication(s) or have any questions.  Labs:  Please stop by the lab on the lower level of the building for your blood work. Your results will be released to MyChart (or called to you) after review, usually within 72 hours after test completion. If any changes need to be made, you will be notified at that same time.  1.) The lab is open from 7:30am to 5:30 pm Monday-Friday 2.) No appointment is necessary 3.) Fasting (if needed) is 6-8 hours after food and drink; black coffee and water are okay   Follow up:  If your symptoms worsen or fail to improve, please contact our office for further instruction, or in case of emergency go directly to the emergency room at the closest medical facility.     Costochondritis Costochondritis is swelling and irritation (inflammation) of the tissue (cartilage) that connects your ribs to your breastbone (sternum). This causes pain in the front of your chest. Usually, the pain:  Starts gradually.  Is in more than one rib. This condition usually goes away on its own over time. Follow these instructions at home:  Do not do anything that makes your pain worse.  If directed, put ice on the painful area:  Put ice in a plastic bag.  Place a towel between your skin and the bag.  Leave the ice on for 20 minutes, 2-3 times a day.  If directed, put heat on the affected area as often as told by your doctor. Use the heat source that your doctor tells you to use,  such as a moist heat pack or a heating pad.  Place a towel between your skin and the heat source.  Leave the heat on for 20-30 minutes.  Take off the heat if your skin turns bright red. This is very important if you cannot feel pain, heat, or cold. You may have a greater risk of getting burned.  Take over-the-counter and prescription medicines only as told by your doctor.  Return to your normal activities as told by your doctor. Ask your doctor what activities are safe for you.  Keep all follow-up visits as told by your doctor. This is important. Contact a doctor if:  You have chills or a fever.  Your pain does not go away or it gets worse.  You have a cough that does not go away. Get help right away if:  You are short of breath. This information is not intended to replace advice given to you by your health care provider. Make sure you discuss any questions you have with your health care provider. Document Released: 11/29/2007 Document Revised: 12/31/2015 Document Reviewed: 10/06/2015 Elsevier Interactive Patient Education  2017 [employer]

## 2016-09-04 NOTE — Assessment & Plan Note (Signed)
Blood pressure slightly elevated today above goal 140/90 with current medication regimen and no adverse side effects. Denies worst headache of life with no new symptoms of end organ damage noted on physical exam. Continue current dosage of valsartan. Encouraged follow sodium diet and monitor blood pressure at home. He has positively lost 15 pounds since most recent office visit. Encouraged continued weight loss to help with symptoms. Continue to monitor.

## 2016-09-04 NOTE — Assessment & Plan Note (Signed)
Symptoms and exam consistent with costochondritis with no significant trauma most likely related to coughing/sneezing and most recent cold symptoms. Treat conservatively with Tylenol and compression as needed. Encourage deep breathing and coughing periodically. Follow-up if symptoms worsen or do not improve.

## 2016-09-08 ENCOUNTER — Encounter (HOSPITAL_COMMUNITY): Payer: Self-pay | Admitting: *Deleted

## 2016-09-08 ENCOUNTER — Emergency Department (HOSPITAL_COMMUNITY)
Admission: EM | Admit: 2016-09-08 | Discharge: 2016-09-08 | Disposition: A | Discharge status: Home / Self Care | Payer: BC Managed Care – PPO | Attending: Physician Assistant | Admitting: Physician Assistant

## 2016-09-08 DIAGNOSIS — E119 Type 2 diabetes mellitus without complications: Secondary | ICD-10-CM | POA: Diagnosis not present

## 2016-09-08 DIAGNOSIS — F1910 Other psychoactive substance abuse, uncomplicated: Secondary | ICD-10-CM | POA: Diagnosis not present

## 2016-09-08 DIAGNOSIS — I10 Essential (primary) hypertension: Secondary | ICD-10-CM | POA: Insufficient documentation

## 2016-09-08 DIAGNOSIS — Z7984 Long term (current) use of oral hypoglycemic drugs: Secondary | ICD-10-CM | POA: Insufficient documentation

## 2016-09-08 DIAGNOSIS — Z79899 Other long term (current) drug therapy: Secondary | ICD-10-CM | POA: Diagnosis not present

## 2016-09-08 DIAGNOSIS — R4182 Altered mental status, unspecified: Secondary | ICD-10-CM | POA: Diagnosis present

## 2016-09-08 DIAGNOSIS — F19920 Other psychoactive substance use, unspecified with intoxication, uncomplicated: Secondary | ICD-10-CM

## 2016-09-08 LAB — CBC WITH DIFFERENTIAL/PLATELET
Basophils Absolute: 0 10*3/uL (ref 0.0–0.1)
Basophils Relative: 0 %
Eosinophils Absolute: 0.1 10*3/uL (ref 0.0–0.7)
Eosinophils Relative: 2 %
HCT: 43.1 % (ref 39.0–52.0)
Hemoglobin: 14.4 g/dL (ref 13.0–17.0)
Lymphocytes Relative: 28 %
Lymphs Abs: 2.3 10*3/uL (ref 0.7–4.0)
MCH: 29.2 pg (ref 26.0–34.0)
MCHC: 33.4 g/dL (ref 30.0–36.0)
MCV: 87.4 fL (ref 78.0–100.0)
Monocytes Absolute: 0.7 10*3/uL (ref 0.1–1.0)
Monocytes Relative: 9 %
Neutro Abs: 5 10*3/uL (ref 1.7–7.7)
Neutrophils Relative %: 61 %
Platelets: 204 10*3/uL (ref 150–400)
RBC: 4.93 MIL/uL (ref 4.22–5.81)
RDW: 12.5 % (ref 11.5–15.5)
WBC: 8.1 10*3/uL (ref 4.0–10.5)

## 2016-09-08 LAB — BASIC METABOLIC PANEL
Anion gap: 10 (ref 5–15)
BUN: 10 mg/dL (ref 6–20)
CO2: 28 mmol/L (ref 22–32)
Calcium: 8.9 mg/dL (ref 8.9–10.3)
Chloride: 98 mmol/L — ABNORMAL LOW (ref 101–111)
Creatinine, Ser: 0.77 mg/dL (ref 0.61–1.24)
GFR calc Af Amer: >60 mL/min (ref >60)
GFR calc non Af Amer: >60 mL/min (ref >60)
Glucose, Bld: 323 mg/dL — ABNORMAL HIGH (ref 65–99)
Potassium: 3.6 mmol/L (ref 3.5–5.1)
Sodium: 136 mmol/L (ref 135–145)

## 2016-09-08 LAB — URINALYSIS, ROUTINE W REFLEX MICROSCOPIC
Bacteria, UA: NONE SEEN
Bilirubin Urine: NEGATIVE
Glucose, UA: >=500 mg/dL — AB
Hgb urine dipstick: NEGATIVE
Ketones, ur: 5 mg/dL — AB
Leukocytes, UA: NEGATIVE
Nitrite: NEGATIVE
Protein, ur: NEGATIVE mg/dL
Specific Gravity, Urine: 1.039 — ABNORMAL HIGH (ref 1.005–1.030)
Squamous Epithelial / LPF: NONE SEEN
pH: 5 (ref 5.0–8.0)

## 2016-09-08 LAB — RAPID URINE DRUG SCREEN, HOSP PERFORMED
Amphetamines: NOT DETECTED
Barbiturates: NOT DETECTED
Benzodiazepines: POSITIVE — AB
Cocaine: NOT DETECTED
Opiates: POSITIVE — AB
Tetrahydrocannabinol: NOT DETECTED

## 2016-09-08 MED ORDER — SODIUM CHLORIDE 0.9 % IV BOLUS (SEPSIS)
1000.0000 mL | Freq: Once | INTRAVENOUS | Status: AC
  Administered 2016-09-08: 1000 mL via INTRAVENOUS

## 2016-09-08 NOTE — ED Notes (Signed)
PT ambulates without difficulty. EDP made aware.

## 2016-09-08 NOTE — Discharge Instructions (Signed)
Please do not mix medications as they can cause significant drowsiness.  If you develop any new or worsening signs or symptoms please return immediately to the emergency room for reevaluation.

## 2016-09-08 NOTE — ED Provider Notes (Signed)
[facility]-EMERGENCY DEPT Provider Note   CSN: [id number] Arrival date & time: 09/08/16  1038   History   Chief Complaint Chief Complaint  Patient presents with  . Altered Mental Status  . Stroke Symptoms    HPI Jason [name] is a 46 y.o. male.  HPI   46 year old male presents today with altered mental status.  Patient notes that he was sleeping this morning woke up with back pain.  He reports taking a soma, and two hydrocodone.  He remembers calling his supervisor at work and informing him that he would not be coming into work today.  He notes he woke up to numerous people including EMS at his house.  Patient reports that he is groggy from the medication he is taken, but is not acutely altered or having any significant complaints.  He denies any neurological deficits, he is alert and oriented.  He notes that these are side effects of the medication, and has had this feeling before with taking his medication.  Patient does note a remote history of suicide attempt with overdose, but reports that he in no way attempted to overdose.  Patient denies any recent illnesses.  He reports taking his other medications as directed.  He has no suicidal ideations.    Past Medical History:  Diagnosis Date  . Anxiety   . Back pain   . Chicken pox   . Depression   . Diabetes mellitus without complication (HCC)   . Hyperlipidemia   . Hypertension   . Kidney stones   . Ulceration of colon     Patient Active Problem List   Diagnosis Date Noted  . Costochondritis 09/04/2016  . Viral infection 01/25/2016  . Obstructive sleep apnea 07/22/2015  . Chronic back pain 05/14/2015  . Chronic elbow pain 05/14/2015  . Chronic pain of left ankle 05/14/2015  . Severe episode of recurrent major depressive disorder, without psychotic features (HCC)   . 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 GAD (generalized anxiety disorder) 04/07/2015  . 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 Intentional clonazepam overdose (HCC)   . Depression 03/15/2015  . Type 2 diabetes mellitus, uncontrolled (HCC) 03/15/2015  . Essential hypertension 03/15/2015  . Insomnia 03/15/2015  . Panic attacks 03/15/2015  . Overdose 03/08/2015  . Lactic acidosis 03/08/2015  . Hyponatremia 03/08/2015  . Hypomagnesemia 03/08/2015    Past Surgical History:  Procedure Laterality Date  . BACK SURGERY    . FRACTURE SURGERY     ankle, arm,   . HERNIA REPAIR    . SPINE SURGERY         Home Medications    Prior to Admission medications   Medication Sig Start Date End Date Taking? Authorizing Provider  carisoprodol (SOMA) 350 MG tablet Take 1 tablet (350 mg total) by mouth 3 (three) times daily. 08/31/15   Veryl SpeakGregory D Calone, FNP  clonazePAM (KLONOPIN) 1 MG tablet TAKE ONE TABLET BY MOUTH ONCE DAILY AS NEEDED FOR ANXIETY 08/01/16   Veryl SpeakGregory D Calone, FNP  escitalopram (LEXAPRO) 20 MG tablet Take 1 tablet (20 mg total) by mouth daily. 01/17/16   Veryl SpeakGregory D Calone, FNP  gabapentin (NEURONTIN) 100 MG capsule TAKE TWO CAPSULES BY MOUTH TWICE DAILY 07/17/16   Veryl SpeakGregory D Calone, FNP  glyBURIDE (DIABETA) 5 MG tablet Take 1 tablet (5 mg total) by mouth 2 (two) times daily. 08/31/15   Veryl SpeakGregory D Calone, FNP  HYDROcodone-acetaminophen (NORCO/VICODIN) 5-325 MG tablet Take 1 tablet by mouth every  8 (eight) hours as needed for moderate pain. 08/26/15   Veryl Speak, FNP  metFORMIN (GLUCOPHAGE) 1000 MG tablet Take 1 tablet (1,000 mg total) by mouth 2 (two) times daily with a meal. 07/06/15   Veryl Speak, FNP  omeprazole (PRILOSEC) 20 MG capsule Take 1 capsule (20 mg total) by mouth daily. 10/20/15   Veryl Speak, FNP  QUEtiapine (SEROQUEL) 50 MG tablet TAKE ONE TABLET BY MOUTH AT BEDTIME 08/16/16   Veryl Speak, FNP  tiZANidine (ZANAFLEX) 4 MG tablet Take 1 tablet (4 mg total) by mouth every 8 (eight) hours as needed for muscle spasms. 11/29/15   Guinevere Scarlet, MD  valsartan (DIOVAN) 160 MG  tablet TAKE ONE TABLET BY MOUTH ONCE DAILY 08/16/16   Veryl Speak, FNP  zolpidem (AMBIEN) 10 MG tablet TAKE ONE TABLET BY MOUTH AT BEDTIME AS NEEDED FOR SLEEP 08/23/16   Veryl Speak, FNP    Family History Family History  Problem Relation Age of Onset  . Congestive Heart Failure Father   . Hyperlipidemia Father   . Heart disease Father   . Stroke Father   . Hypertension Father   . Diabetes Father   . Cancer Father   . Hyperlipidemia Mother   . Heart disease Mother   . Hypertension Mother   . Diabetes Mother   . Heart disease Sister     Social History Social History  Substance Use Topics  . Smoking status: Never Smoker  . Smokeless tobacco: Never Used  . Alcohol use Yes     Comment: Rarely     Allergies   Ibuprofen; Lactose intolerance (gi); and Tape   Review of Systems Review of Systems  All other systems reviewed and are negative.    Physical Exam Updated Vital Signs BP 129/87   Pulse 96   Temp 98.8 F (37.1 C) (Oral)   Resp 15   SpO2 99%   Physical Exam  Constitutional: He is oriented to person, place, and time. He appears well-developed and well-nourished.  HENT:  Head: Normocephalic and atraumatic.  Eyes: Conjunctivae are normal. Pupils are equal, round, and reactive to light. Right eye exhibits no discharge. Left eye exhibits no discharge. No scleral icterus.  Neck: Normal range of motion. No JVD present. No tracheal deviation present.  Pulmonary/Chest: Effort normal. No stridor.  Neurological: He is alert and oriented to person, place, and time. He has normal strength. No cranial nerve deficit or sensory deficit. Coordination normal. GCS eye subscore is 4. GCS verbal subscore is 5. GCS motor subscore is 6.  Psychiatric: He has a normal mood and affect. His behavior is normal. Judgment and thought content normal.  Nursing note and vitals reviewed.    ED Treatments / Results  Labs (all labs ordered are listed, but only abnormal results are  displayed) Labs Reviewed  BASIC METABOLIC PANEL - Abnormal; Notable for the following:       Result Value   Chloride 98 (*)    Glucose, Bld 323 (*)    All other components within normal limits  URINALYSIS, ROUTINE W REFLEX MICROSCOPIC - Abnormal; Notable for the following:    Specific Gravity, Urine 1.039 (*)    Glucose, UA >=500 (*)    Ketones, ur 5 (*)    All other components within normal limits  RAPID URINE DRUG SCREEN, HOSP PERFORMED - Abnormal; Notable for the following:    Opiates POSITIVE (*)    Benzodiazepines POSITIVE (*)    All other  components within normal limits  CBC WITH DIFFERENTIAL/PLATELET    EKG  EKG Interpretation None       Radiology No results found.  Procedures Procedures (including critical care time)  Medications Ordered in ED Medications  sodium chloride 0.9 % bolus 1,000 mL (0 mLs Intravenous Stopped 09/08/16 1240)     Initial Impression / Assessment and Plan / ED Course  I have reviewed the triage vital signs and the nursing notes.  Pertinent labs & imaging results that were available during my care of the patient were reviewed by me and considered in my medical decision making (see chart for details).      Final Clinical Impressions(s) / ED Diagnoses   Final diagnoses:  Drug intoxication without complication (HCC)    Labs: Urinalysis, rapid urine drug screen, CBC, BMP  Imaging:   Consults:  Therapeutics: ns  Discharge Meds:   Assessment/Plan: 46 year old male presents today with drug intoxication.  Patient is taking multiple medications at home, appears to be sleepy secondary to the medications.  He has no focal neurological deficits he is alert and oriented.  Patient has medications at bedside.  Patient does have a history of type 2 diabetes, he will be screened to rule out metabolic encephalopathy.  I have very little concern for intracranial abnormality as this is very consistent with medication.  Patient will be monitored  here in the ED with laboratory analysis pending.  Patient positive for benzodiazepines and opioids as suspected.  He is monitored here in the ED given fluids, he appears to be clinically sober, ambulates without significant difficulties.  Patient will be discharged home with proper instructions of medication use encouraged to not use multiple medications at the same time.  Patient understood and agreed to today's plan had no further questions or concerns at time of discharge.   New Prescriptions Discharge Medication List as of 09/08/2016  1:26 PM       Eyvonne Mechanic, PA-C 09/08/16 1520    Courteney Lyn Mackuen, MD 09/08/16 2045

## 2016-09-08 NOTE — ED Notes (Signed)
PT alert and oriented and ambulatory at discharge. PT's girlfriend coming to pick him up. PT denies wheelchair to waiting room.

## 2016-09-08 NOTE — ED Notes (Signed)
ED Provider at bedside. 

## 2016-09-08 NOTE — ED Triage Notes (Addendum)
Per ems, pt's friend called him and noticed slurred speech. Ems was at pt's house yesterday morning for same symptoms. Initially pt was only oriented x 2 on ems arrival but then became more awake and oriented x 4 prior to arrival to ed.pt had filled prescription for clonazepam 30 tablets on 3/7 and bottle is now empty, had hydrocodone filled on 3/9 and several missing from that bottle also. Denies etoh. Pt has slurred speech, no unilateral deficits noted by ems.

## 2016-09-08 NOTE — ED Notes (Signed)
PT reports he would like to leave because the stretcher is bothering his back. PT offered repositioning, PT refuses. EDP made aware

## 2016-09-13 ENCOUNTER — Ambulatory Visit (INDEPENDENT_AMBULATORY_CARE_PROVIDER_SITE_OTHER): Payer: BC Managed Care – PPO | Admitting: Nurse Practitioner

## 2016-09-13 ENCOUNTER — Ambulatory Visit (INDEPENDENT_AMBULATORY_CARE_PROVIDER_SITE_OTHER)
Admission: RE | Admit: 2016-09-13 | Discharge: 2016-09-13 | Disposition: A | Discharge status: Home / Self Care | Payer: BC Managed Care – PPO | Source: Ambulatory Visit | Attending: Nurse Practitioner | Admitting: Nurse Practitioner

## 2016-09-13 ENCOUNTER — Telehealth: Payer: Self-pay

## 2016-09-13 ENCOUNTER — Encounter: Payer: Self-pay | Admitting: Nurse Practitioner

## 2016-09-13 VITALS — BP 148/102 | HR 91 | Temp 98.0°F | Ht 67.0 in | Wt 192.0 lb

## 2016-09-13 DIAGNOSIS — S2242XD Multiple fractures of ribs, left side, subsequent encounter for fracture with routine healing: Secondary | ICD-10-CM

## 2016-09-13 DIAGNOSIS — R0789 Other chest pain: Secondary | ICD-10-CM

## 2016-09-13 DIAGNOSIS — F333 Major depressive disorder, recurrent, severe with psychotic symptoms: Secondary | ICD-10-CM

## 2016-09-13 MED ORDER — ACETAMINOPHEN 500 MG PO TABS: 500.0000 mg | ORAL_TABLET | Freq: Four times a day (QID) | ORAL | 0 refills | Status: AC | PRN

## 2016-09-13 MED ORDER — TRAMADOL HCL 50 MG PO TABS: 50.0000 mg | ORAL_TABLET | Freq: Four times a day (QID) | ORAL | 0 refills | Status: DC | PRN

## 2016-09-13 NOTE — Patient Instructions (Signed)
Go to basement for CXR. You will be called with results.  Chest Wall Pain Chest wall pain is pain in or around the bones and muscles of your chest. Sometimes, an injury causes this pain. Sometimes, the cause may not be known. This pain may take several weeks or longer to get better. Follow these instructions at home: Pay attention to any changes in your symptoms. Take these actions to help with your pain:  Rest as told by your health care provider.  Avoid activities that cause pain. These include any activities that use your chest muscles or your abdominal and side muscles to lift heavy items.  If directed, apply ice to the painful area:  Put ice in a plastic bag.  Place a towel between your skin and the bag.  Leave the ice on for 20 minutes, 2-3 times per day.  Take over-the-counter and prescription medicines only as told by your health care provider.  Do not use tobacco products, including cigarettes, chewing tobacco, and e-cigarettes. If you need help quitting, ask your health care provider.  Keep all follow-up visits as told by your health care provider. This is important. Contact a health care provider if:  You have a fever.  Your chest pain becomes worse.  You have new symptoms. Get help right away if:  You have nausea or vomiting.  You feel sweaty or light-headed.  You have a cough with phlegm (sputum) or you cough up blood.  You develop shortness of breath. This information is not intended to replace advice given to you by your health care provider. Make sure you discuss any questions you have with your health care provider. Document Released: 06/12/2005 Document Revised: 10/21/2015 Document Reviewed: 09/07/2014 Elsevier Interactive Patient Education  2017 [employer]

## 2016-09-13 NOTE — Progress Notes (Signed)
Subjective:  Patient ID: Jason [name], male    DOB: 23-Apr-1971  Age: 46 y.o. MRN: [id number]  CC: Pain (left side pain for 15 days. )  Chest Pain   This is a new problem. The current episode started 1 to 4 weeks ago. The onset quality is gradual. The problem occurs daily. The problem has been gradually worsening. The pain is present in the lateral region (left side chest wall). The pain is severe. The quality of the pain is described as sharp. The pain does not radiate. Pertinent negatives include no back pain, cough, diaphoresis, dizziness, exertional chest pressure, fever, irregular heartbeat, malaise/fatigue, nausea, orthopnea, palpitations, shortness of breath, sputum production or syncope. He has tried nothing for the symptoms. Risk factors include substance abuse and male gender.  Jason [name] states, he thinks he might have fallen while under the influence of klonopin and ambien. He is not quite sure how he fall or what he fell on. States pain has been getting worse since 08/29/16. He was evaluated by pcp for similar complaint 09/04/16 and advised to use pain medication as prescribed. He was seen in ED 09/08/16 for altered mental status secondary to drug intoxication.  Outpatient Medications Prior to Visit  Medication Sig Dispense Refill  . carisoprodol (SOMA) 350 MG tablet Take 1 tablet (350 mg total) by mouth 3 (three) times daily. 90 tablet 0  . escitalopram (LEXAPRO) 20 MG tablet Take 1 tablet (20 mg total) by mouth daily. 30 tablet 2  . gabapentin (NEURONTIN) 100 MG capsule TAKE TWO CAPSULES BY MOUTH TWICE DAILY 120 capsule 2  . glyBURIDE (DIABETA) 5 MG tablet Take 1 tablet (5 mg total) by mouth 2 (two) times daily. 90 tablet 0  . HYDROcodone-acetaminophen (NORCO/VICODIN) 5-325 MG tablet Take 1 tablet by mouth every 8 (eight) hours as needed for moderate pain. 90 tablet 0  . metFORMIN (GLUCOPHAGE) 1000 MG tablet Take 1 tablet (1,000 mg total) by mouth 2 (two) times daily with a meal. 60  tablet 3  . omeprazole (PRILOSEC) 20 MG capsule Take 1 capsule (20 mg total) by mouth daily. 90 capsule 1  . QUEtiapine (SEROQUEL) 50 MG tablet TAKE ONE TABLET BY MOUTH AT BEDTIME 30 tablet 3  . tiZANidine (ZANAFLEX) 4 MG tablet Take 1 tablet (4 mg total) by mouth every 8 (eight) hours as needed for muscle spasms. 30 tablet 0  . valsartan (DIOVAN) 160 MG tablet TAKE ONE TABLET BY MOUTH ONCE DAILY 90 tablet 0  . clonazePAM (KLONOPIN) 1 MG tablet TAKE ONE TABLET BY MOUTH ONCE DAILY AS NEEDED FOR ANXIETY (Patient not taking: Reported on 09/13/2016) 30 tablet 1  . zolpidem (AMBIEN) 10 MG tablet TAKE ONE TABLET BY MOUTH AT BEDTIME AS NEEDED FOR SLEEP (Patient not taking: Reported on 09/13/2016) 30 tablet 1   No facility-administered medications prior to visit.     ROS See HPI  Objective:  BP (!) 148/102   Pulse 91   Temp 98 F (36.7 C)   Ht 5\' 7"  (1.702 m)   Wt 192 lb (87.1 kg)   SpO2 98%   BMI 30.07 kg/m   BP Readings from Last 3 Encounters:  09/13/16 (!) 148/102  09/08/16 129/87  09/04/16 (!) 144/94    Wt Readings from Last 3 Encounters:  09/13/16 192 lb (87.1 kg)  09/04/16 190 lb (86.2 kg)  01/25/16 205 lb (93 kg)    Physical Exam  Constitutional: He is oriented to person, place, and time.  Neck: Normal range of  motion. Neck supple.  Cardiovascular: Normal rate, regular rhythm and normal heart sounds.   Pulmonary/Chest: Effort normal and breath sounds normal. He exhibits tenderness.  Left lateral chest wall.  Musculoskeletal: Normal range of motion. He exhibits no edema or tenderness.  Neurological: He is alert and oriented to person, place, and time.  Skin: Skin is warm and dry. No rash noted. No erythema.  Vitals reviewed.   Lab Results  Component Value Date   WBC 8.1 09/08/2016   HGB 14.4 09/08/2016   HCT 43.1 09/08/2016   PLT 204 09/08/2016   GLUCOSE 323 (H) 09/08/2016   ALT 15 09/04/2016   AST 14 09/04/2016   NA 136 09/08/2016   K 3.6 09/08/2016   CL 98  (L) 09/08/2016   CREATININE 0.77 09/08/2016   BUN 10 09/08/2016   CO2 28 09/08/2016   INR 1.07 04/05/2015   HGBA1C 12.1 (H) 09/04/2016   MICROALBUR 2.0 (H) 09/04/2016    No results found.  Assessment & Plan:   Jason [name] was seen today for pain.  Diagnoses and all orders for this visit:  Closed fracture of multiple ribs of left side with routine healing, subsequent encounter -     DG Ribs Unilateral Left; Future -     acetaminophen (TYLENOL) 500 MG tablet; Take 1 tablet (500 mg total) by mouth every 6 (six) hours as needed. -     traMADol (ULTRAM) 50 MG tablet; Take 1 tablet (50 mg total) by mouth every 6 (six) hours as needed.   I am having Jason [name] start on acetaminophen and traMADol. I am also having him maintain his metFORMIN, HYDROcodone-acetaminophen, carisoprodol, glyBURIDE, omeprazole, tiZANidine, escitalopram, gabapentin, clonazePAM, QUEtiapine, valsartan, and zolpidem.  Meds ordered this encounter  Medications  . acetaminophen (TYLENOL) 500 MG tablet    Sig: Take 1 tablet (500 mg total) by mouth every 6 (six) hours as needed.    Dispense:  30 tablet    Refill:  0    Order Specific Question:   Supervising Provider    Answer:   Tresa GarterPLOTNIKOV, ALEKSEI V [[id number]]  . traMADol (ULTRAM) 50 MG tablet    Sig: Take 1 tablet (50 mg total) by mouth every 6 (six) hours as needed.    Dispense:  12 tablet    Refill:  0    Order Specific Question:   Supervising Provider    Answer:   Tresa GarterPLOTNIKOV, ALEKSEI V [[id number]]    Follow-up: Return if symptoms worsen or fail to improve.  Alysia Pennaharlotte Marcelia Petersen, NP

## 2016-09-13 NOTE — Progress Notes (Signed)
Pre visit review using our clinic review tool, if applicable. No additional management support is needed unless otherwise documented below in the visit note. 

## 2016-09-13 NOTE — Telephone Encounter (Signed)
Called pt and let him know that referral to psychiatry has been made and the FMLA papers that were left at his visit with Charlotte cClaris Gowerould not be filled out due to numerous errors. Did ask pt to bring in blank form in for Tammy SoursGreg to fill out.

## 2016-09-19 NOTE — Telephone Encounter (Signed)
Its ready for pick up

## 2016-09-19 NOTE — Telephone Encounter (Signed)
Pt called to check to see if the FMLA form was completed so that he could come pick it up. He said that it is due today. I can call him when it is ready. Thanks [employer]

## 2016-09-20 NOTE — Telephone Encounter (Signed)
FMLA paperwork has been faxed.

## 2016-09-21 DIAGNOSIS — Z0289 Encounter for other administrative examinations: Secondary | ICD-10-CM

## 2016-09-21 NOTE — Telephone Encounter (Signed)
Patient was charged for fmla paperwork.

## 2016-10-09 ENCOUNTER — Other Ambulatory Visit: Payer: Self-pay | Admitting: Family

## 2016-10-30 ENCOUNTER — Other Ambulatory Visit: Payer: Self-pay | Admitting: Family

## 2016-10-30 DIAGNOSIS — IMO0001 Reserved for inherently not codable concepts without codable children: Secondary | ICD-10-CM

## 2016-10-30 DIAGNOSIS — E1165 Type 2 diabetes mellitus with hyperglycemia: Principal | ICD-10-CM

## 2016-10-30 NOTE — Telephone Encounter (Signed)
Are you ok with filling gabapentin

## 2016-11-06 ENCOUNTER — Ambulatory Visit (HOSPITAL_COMMUNITY): Payer: Self-pay | Admitting: Psychiatry

## 2016-12-26 ENCOUNTER — Encounter (INDEPENDENT_AMBULATORY_CARE_PROVIDER_SITE_OTHER): Payer: Self-pay

## 2016-12-26 ENCOUNTER — Encounter (HOSPITAL_COMMUNITY): Payer: Self-pay | Admitting: Psychiatry

## 2016-12-26 ENCOUNTER — Ambulatory Visit (INDEPENDENT_AMBULATORY_CARE_PROVIDER_SITE_OTHER): Payer: BC Managed Care – PPO | Admitting: Psychiatry

## 2016-12-26 VITALS — BP 128/90 | HR 94 | Ht 66.0 in | Wt 193.0 lb

## 2016-12-26 DIAGNOSIS — E739 Lactose intolerance, unspecified: Secondary | ICD-10-CM | POA: Diagnosis not present

## 2016-12-26 DIAGNOSIS — Z79899 Other long term (current) drug therapy: Secondary | ICD-10-CM

## 2016-12-26 DIAGNOSIS — F411 Generalized anxiety disorder: Secondary | ICD-10-CM

## 2016-12-26 DIAGNOSIS — Z886 Allergy status to analgesic agent status: Secondary | ICD-10-CM

## 2016-12-26 DIAGNOSIS — Z7984 Long term (current) use of oral hypoglycemic drugs: Secondary | ICD-10-CM | POA: Diagnosis not present

## 2016-12-26 DIAGNOSIS — Z915 Personal history of self-harm: Secondary | ICD-10-CM

## 2016-12-26 DIAGNOSIS — F410 Panic disorder [episodic paroxysmal anxiety] without agoraphobia: Secondary | ICD-10-CM

## 2016-12-26 DIAGNOSIS — Z87891 Personal history of nicotine dependence: Secondary | ICD-10-CM

## 2016-12-26 DIAGNOSIS — Z79891 Long term (current) use of opiate analgesic: Secondary | ICD-10-CM

## 2016-12-26 DIAGNOSIS — F5101 Primary insomnia: Secondary | ICD-10-CM | POA: Diagnosis not present

## 2016-12-26 DIAGNOSIS — Z818 Family history of other mental and behavioral disorders: Secondary | ICD-10-CM

## 2016-12-26 DIAGNOSIS — F1310 Sedative, hypnotic or anxiolytic abuse, uncomplicated: Secondary | ICD-10-CM | POA: Diagnosis not present

## 2016-12-26 DIAGNOSIS — F331 Major depressive disorder, recurrent, moderate: Secondary | ICD-10-CM | POA: Insufficient documentation

## 2016-12-26 MED ORDER — ESCITALOPRAM OXALATE 20 MG PO TABS
20.0000 mg | ORAL_TABLET | Freq: Every day | ORAL | 1 refills | Status: AC
Start: 2016-12-26 — End: ?

## 2016-12-26 MED ORDER — ZOLPIDEM TARTRATE 10 MG PO TABS: 10.0000 mg | ORAL_TABLET | Freq: Every evening | ORAL | 1 refills | Status: DC | PRN

## 2016-12-26 MED ORDER — PROPRANOLOL HCL 10 MG PO TABS: 10.0000 mg | ORAL_TABLET | Freq: Three times a day (TID) | ORAL | 1 refills | Status: AC
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
## 2017-02-27 ENCOUNTER — Ambulatory Visit (HOSPITAL_COMMUNITY): Payer: Self-pay | Admitting: Psychiatry

## 2017-05-30 IMAGING — CT CT HEAD W/O CM
3 of 6 series · 14 of 47 positions shown, 16 images · non-contrast
Comparison: None.

CLINICAL DATA: Fall and confusion.

EXAM:
CT HEAD WITHOUT CONTRAST
CT CERVICAL SPINE WITHOUT CONTRAST
TECHNIQUE: Multidetector CT imaging of the head and cervical spine was
performed following the standard protocol without intravenous
contrast. Multiplanar CT image reconstructions of the cervical spine
were also generated.

[Series 8: axial recon · axial · 0.23mm/px · z∈[+1485,+1620]mm · 8 of 96 slices shown, 10 images]
[im 11/96  brain]
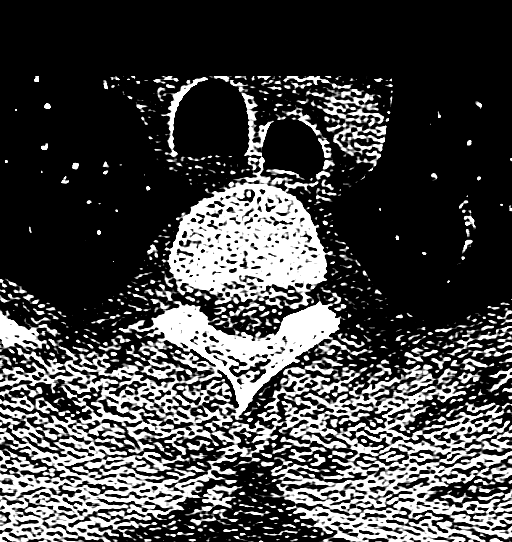
[im 11/96  bone]
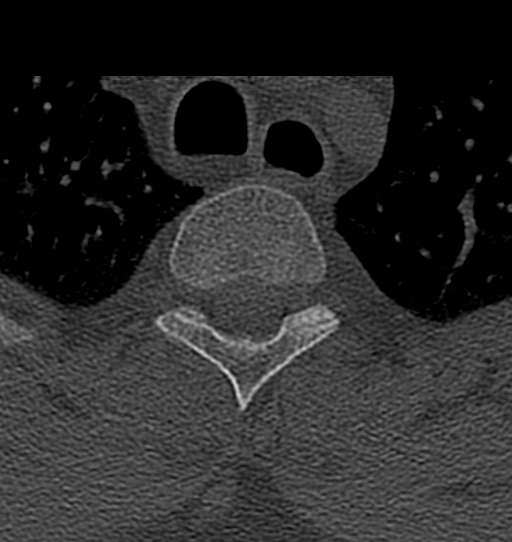
[im 22/96  brain]
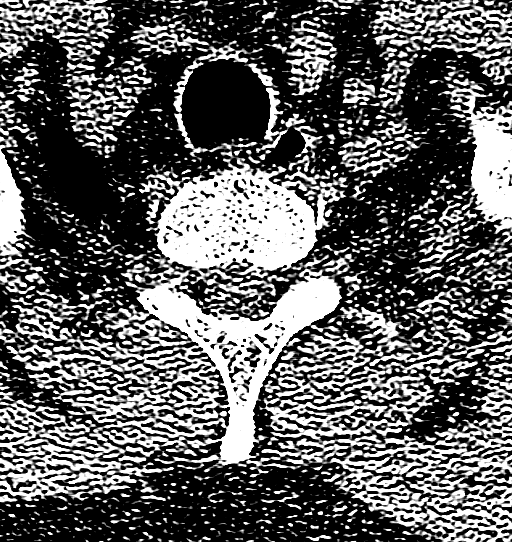
[im 32/96  brain]
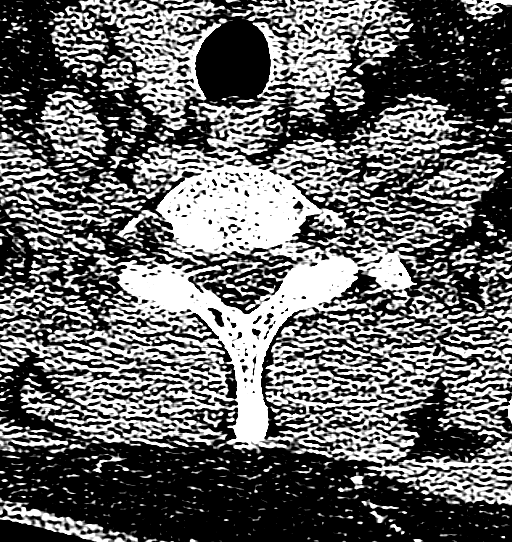
[im 43/96  brain]
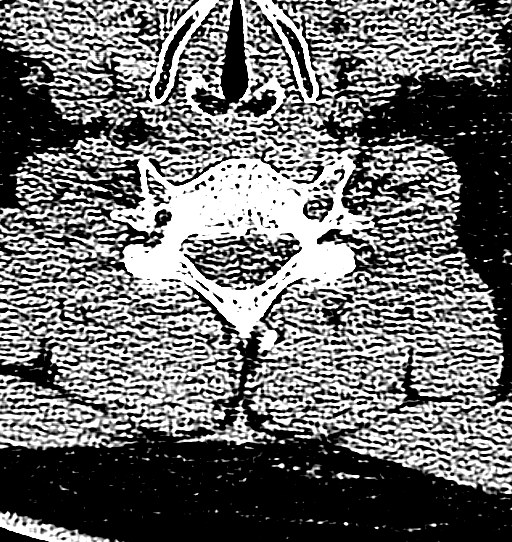
[im 53/96  brain]
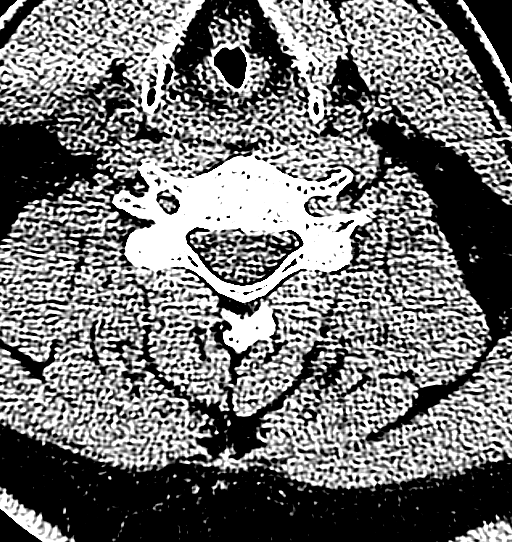
[im 53/96  bone]
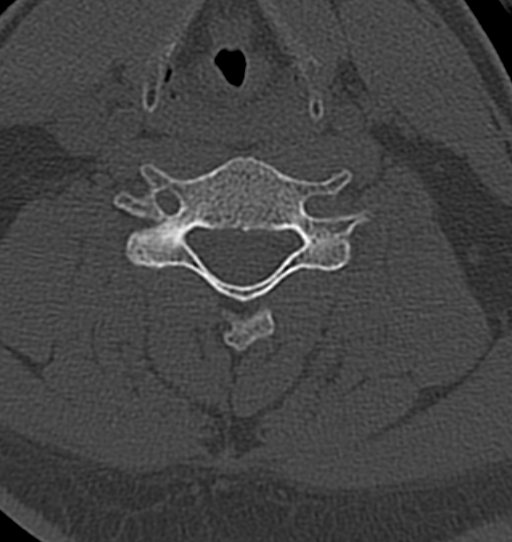
[im 64/96  brain]
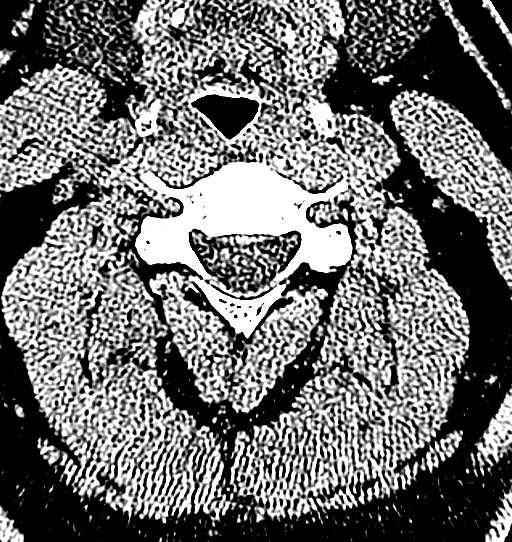
[im 74/96  brain]
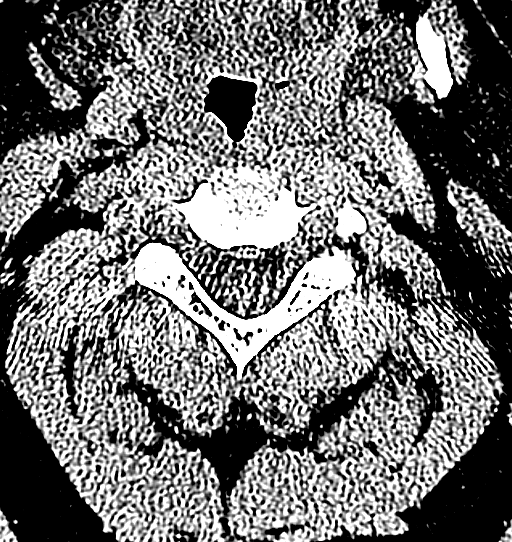
[im 85/96  brain]
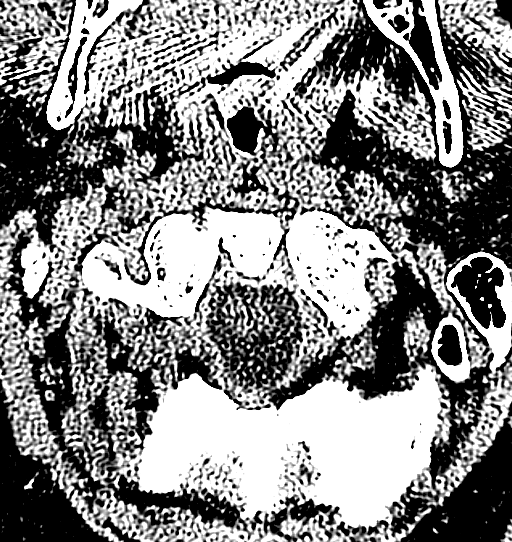

[Series 9: coronal · coronal · 0.27mm/px · 3 of 58 slices shown]
[im 20/58  brain]
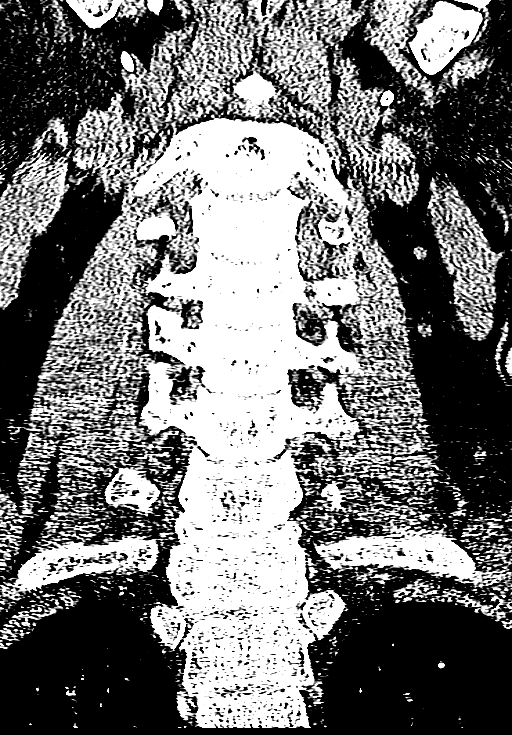
[im 26/58  brain]
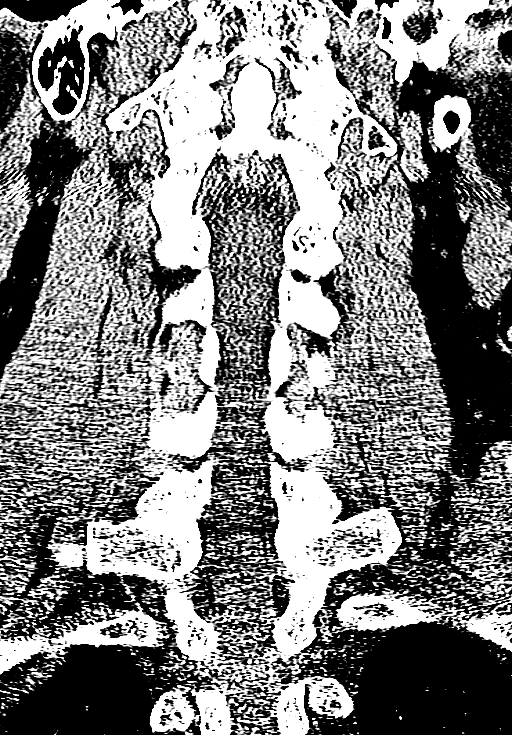
[im 32/58  brain]
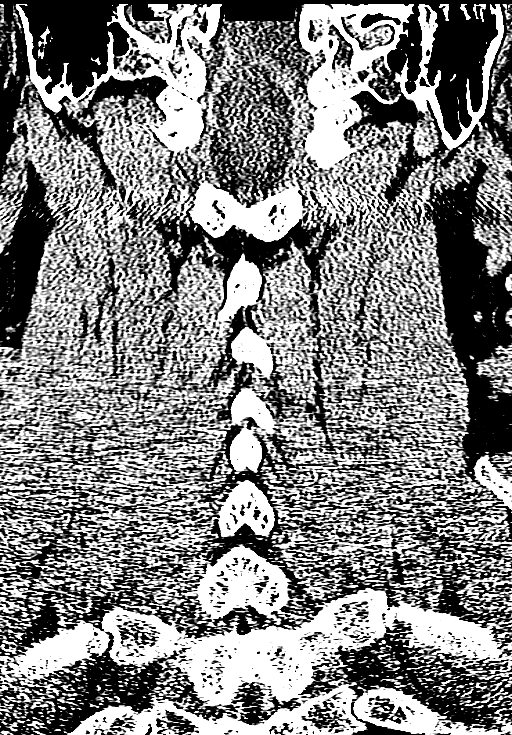

[Series 10: sagittal · sagittal · 0.27mm/px · 3 of 61 slices shown]
[im 21/61  brain]
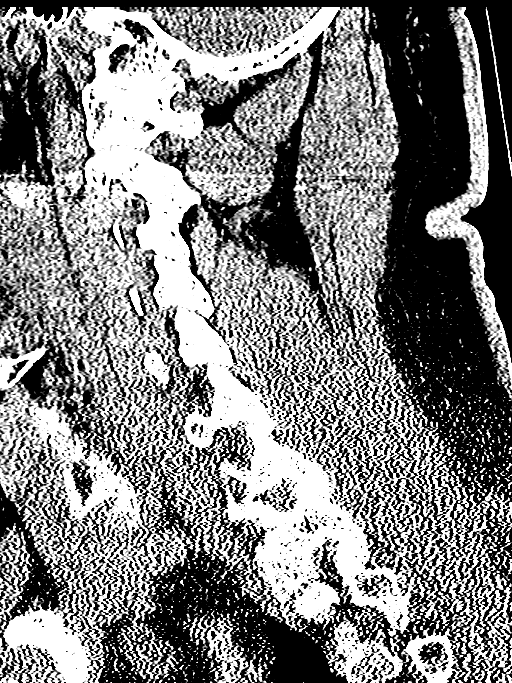
[im 31/61  brain]
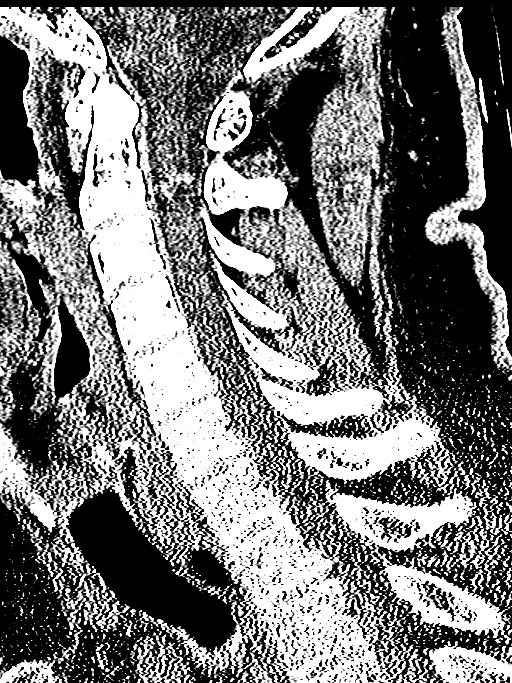
[im 41/61  brain]
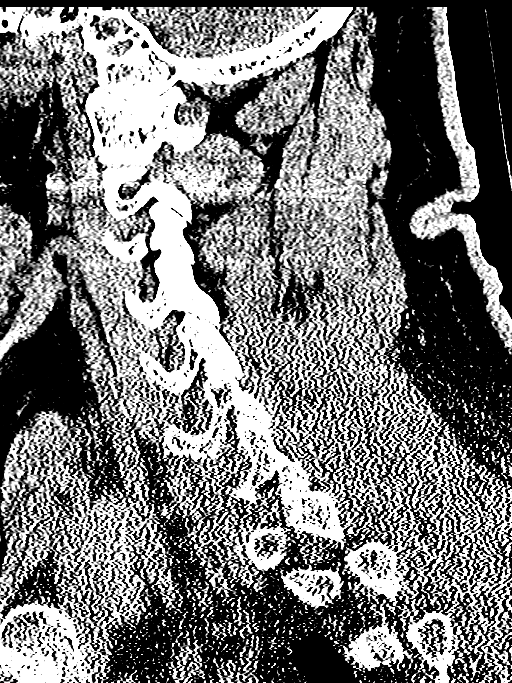

[14 of 47 positions shown; findings below may reference images not displayed]

FINDINGS: CT HEAD FINDINGS

The brain demonstrates no evidence of hemorrhage, infarction, edema,
mass effect, extra-axial fluid collection, hydrocephalus or mass
lesion. The skull is unremarkable.

CT CERVICAL SPINE FINDINGS

The cervical spine shows normal alignment. There is no evidence of
acute fracture or subluxation. No soft tissue swelling or hematoma
is identified. There are no significant degenerative changes. No
bony or soft tissue lesions are seen. The visualized airway is
normally patent.
IMPRESSION: Normal CT studies of the head and cervical spine.

## 2017-05-30 IMAGING — CR DG CHEST 2V
2 series · 2 of 2 positions shown · non-contrast
Comparison: None.

CLINICAL DATA: Pt was found altered by staff of [employer]. Pt Pt admits
to SI. Per EMS, pt took 17,600 mg of gabapentin, 14,000 mg of BARRY,
220 mg of Zolpidem, and 60 mg of clonazepam.

EXAM:
CHEST  2 VIEW

[w chest pa]
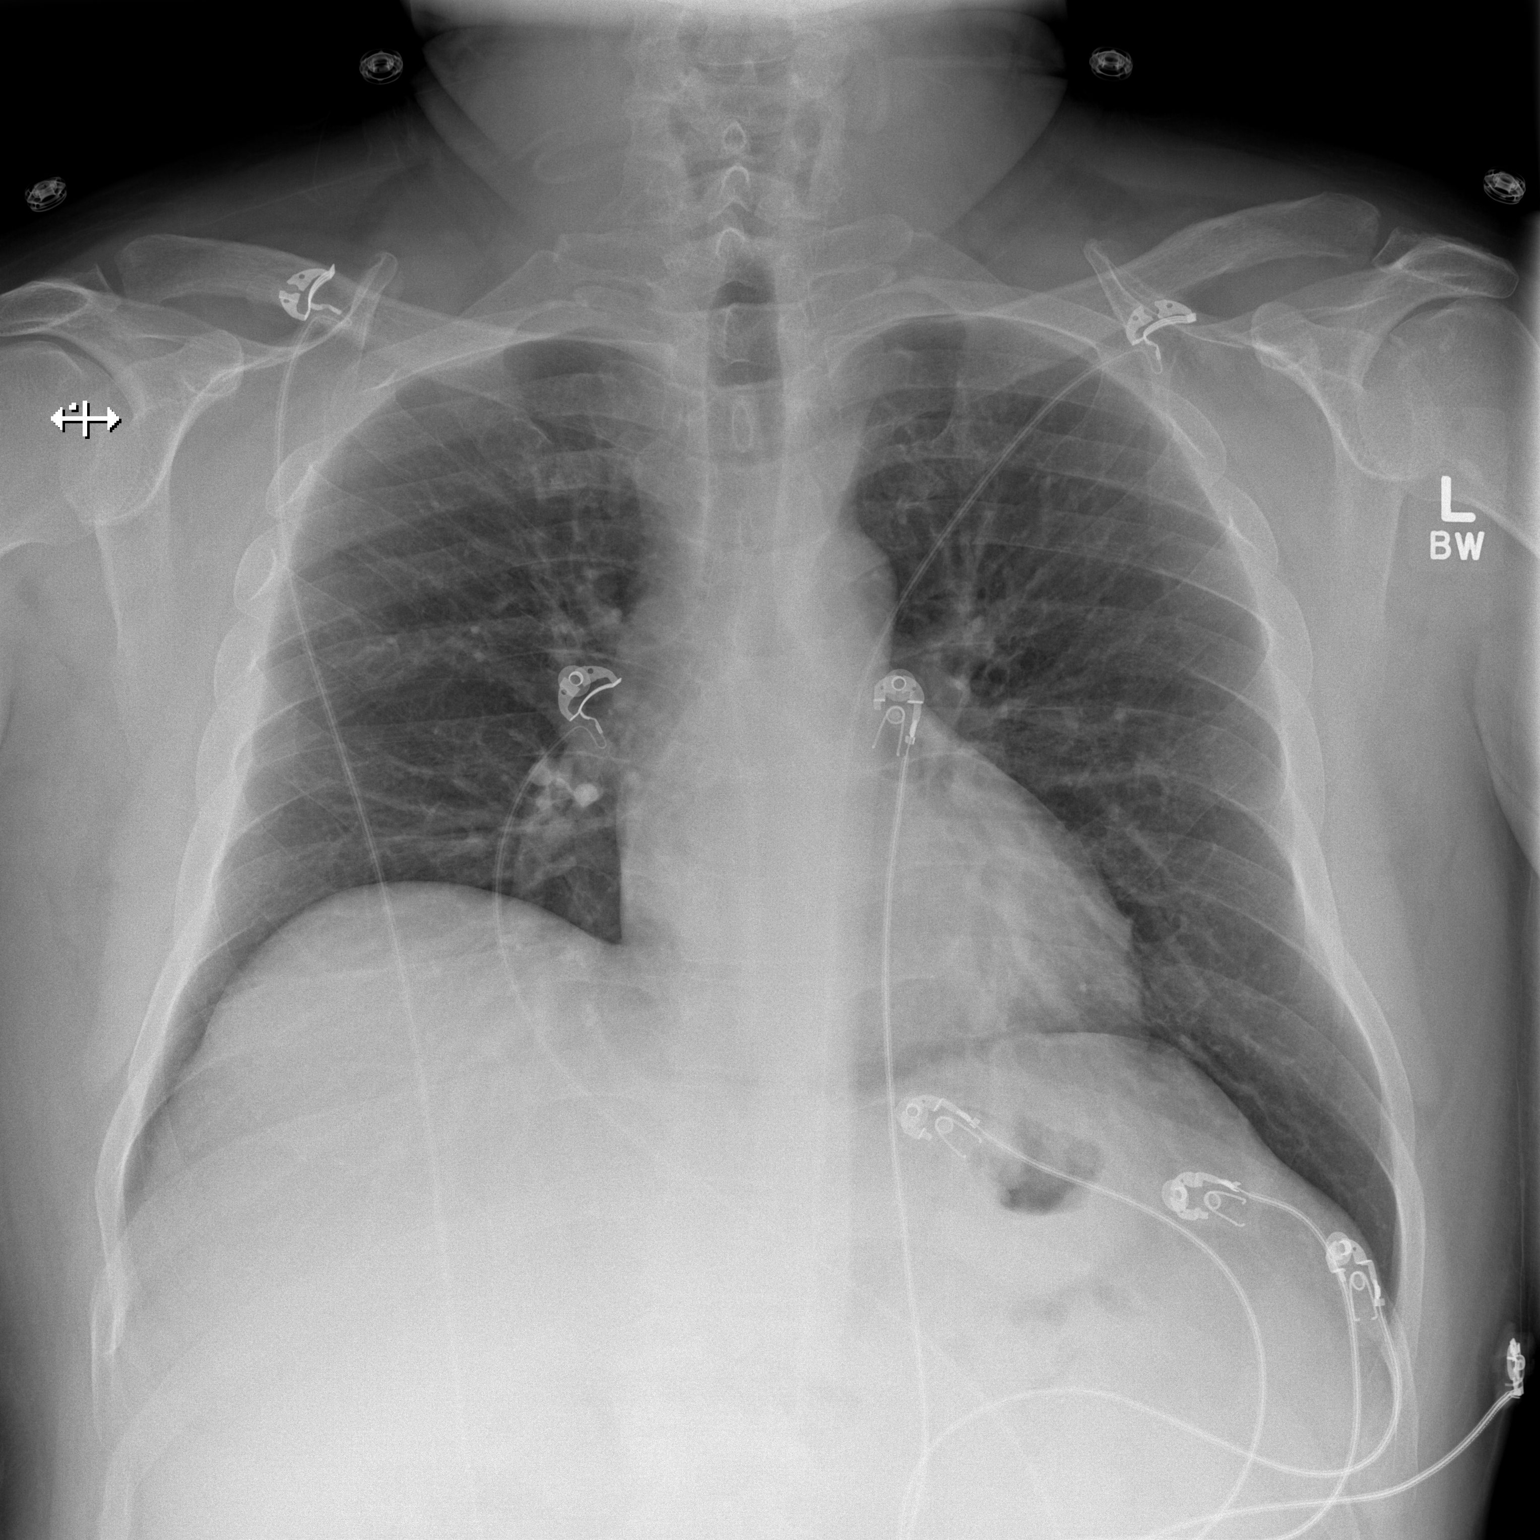

[w chest lat]
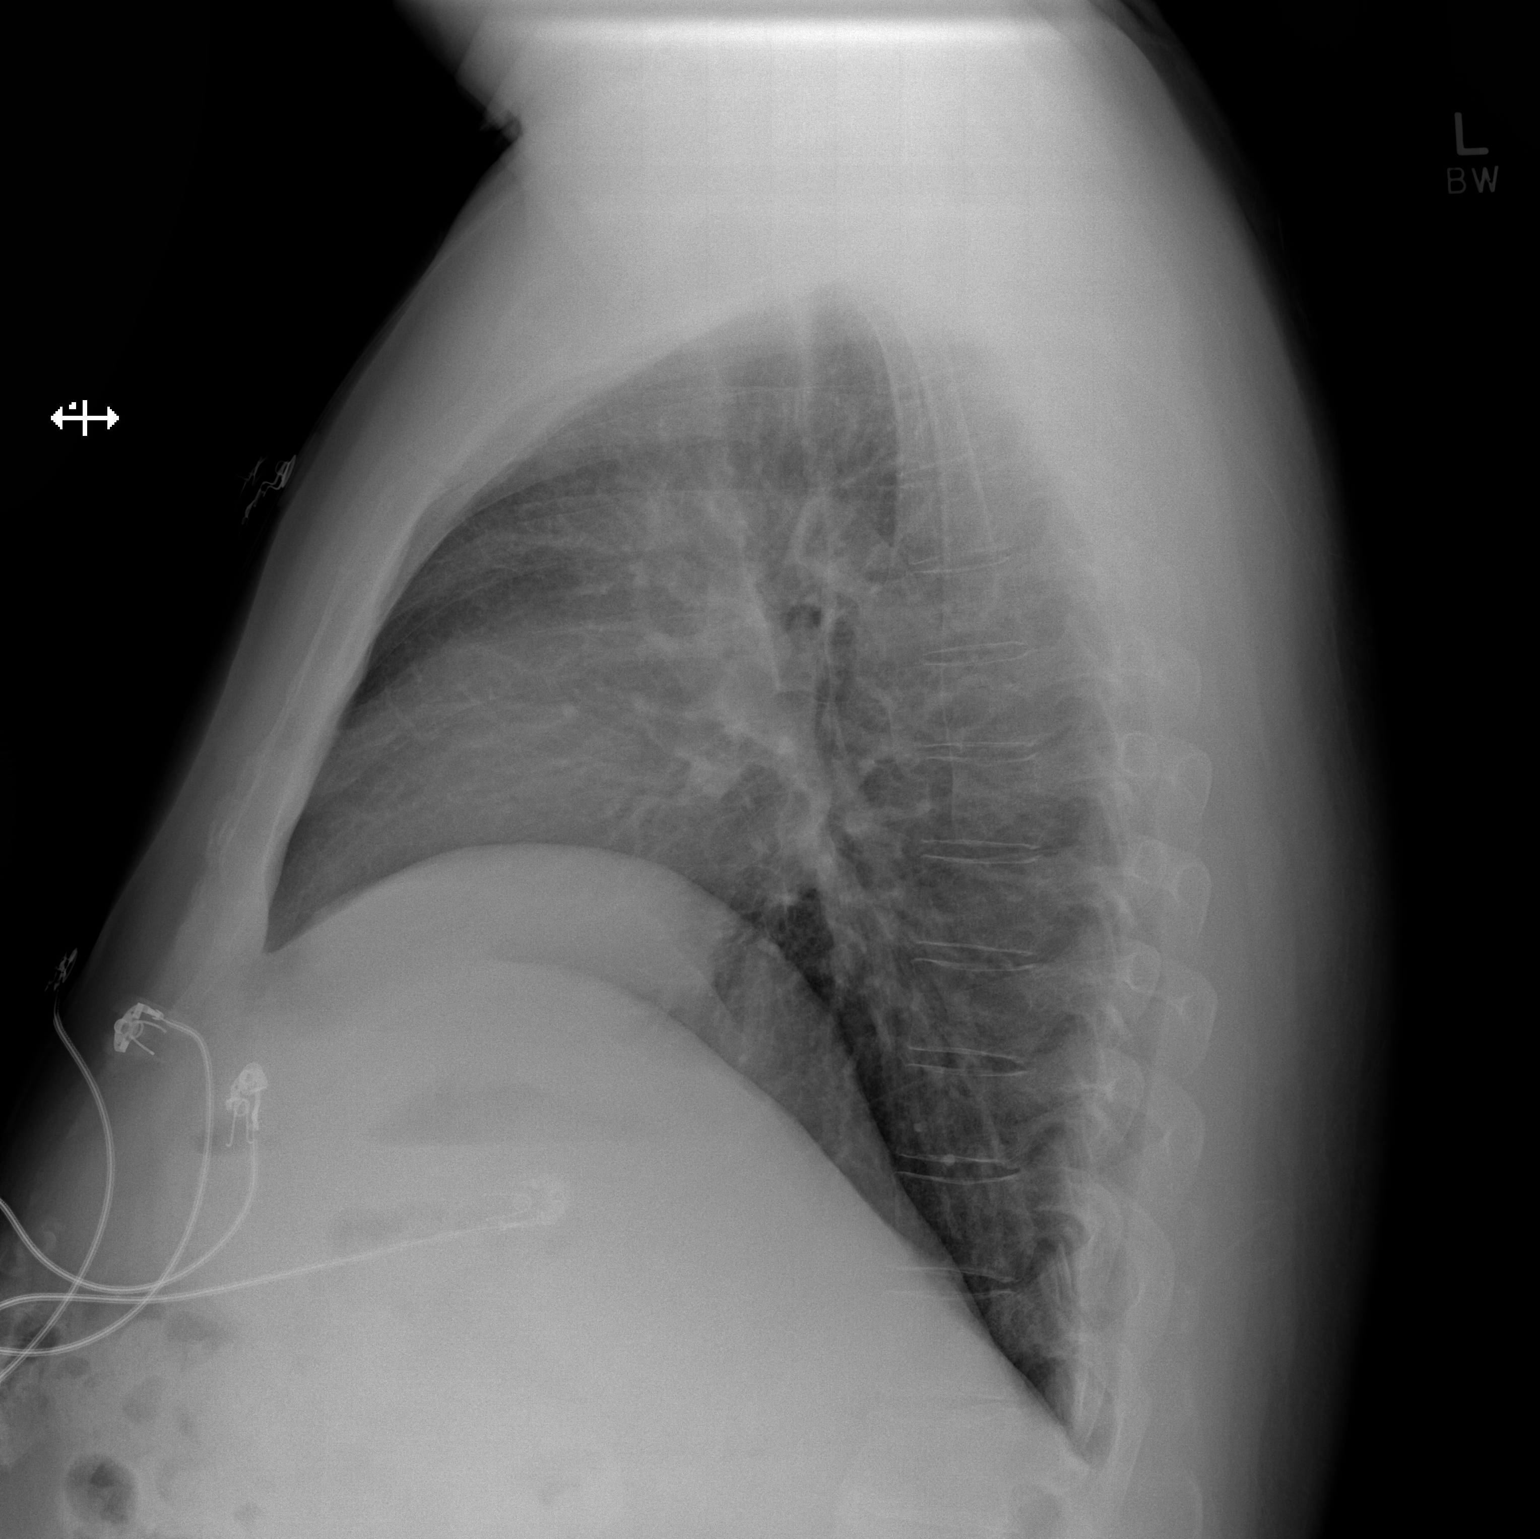

[2 of 2 positions shown; findings below may reference images not displayed]

FINDINGS: The heart size and mediastinal contours are within normal limits.
Both lungs are clear. There is mild wedge compression deformity of
L1, likely chronic.
IMPRESSION: No evidence for acute cardiopulmonary abnormality.

## 2017-06-06 ENCOUNTER — Emergency Department (HOSPITAL_COMMUNITY)
Admission: EM | Admit: 2017-06-06 | Discharge: 2017-06-06 | Disposition: A | Discharge status: Home / Self Care | Payer: BC Managed Care – PPO | Attending: Emergency Medicine | Admitting: Emergency Medicine

## 2017-06-06 ENCOUNTER — Emergency Department (HOSPITAL_COMMUNITY): Payer: BC Managed Care – PPO

## 2017-06-06 ENCOUNTER — Encounter (HOSPITAL_COMMUNITY): Payer: Self-pay

## 2017-06-06 ENCOUNTER — Other Ambulatory Visit: Payer: Self-pay

## 2017-06-06 DIAGNOSIS — E119 Type 2 diabetes mellitus without complications: Secondary | ICD-10-CM | POA: Insufficient documentation

## 2017-06-06 DIAGNOSIS — Z7984 Long term (current) use of oral hypoglycemic drugs: Secondary | ICD-10-CM | POA: Insufficient documentation

## 2017-06-06 DIAGNOSIS — Z79899 Other long term (current) drug therapy: Secondary | ICD-10-CM | POA: Insufficient documentation

## 2017-06-06 DIAGNOSIS — Y9289 Other specified places as the place of occurrence of the external cause: Secondary | ICD-10-CM | POA: Insufficient documentation

## 2017-06-06 DIAGNOSIS — Z87891 Personal history of nicotine dependence: Secondary | ICD-10-CM | POA: Diagnosis not present

## 2017-06-06 DIAGNOSIS — W009XXA Unspecified fall due to ice and snow, initial encounter: Secondary | ICD-10-CM

## 2017-06-06 DIAGNOSIS — G8929 Other chronic pain: Secondary | ICD-10-CM | POA: Insufficient documentation

## 2017-06-06 DIAGNOSIS — I10 Essential (primary) hypertension: Secondary | ICD-10-CM | POA: Diagnosis not present

## 2017-06-06 DIAGNOSIS — W010XXA Fall on same level from slipping, tripping and stumbling without subsequent striking against object, initial encounter: Secondary | ICD-10-CM | POA: Insufficient documentation

## 2017-06-06 DIAGNOSIS — M549 Dorsalgia, unspecified: Secondary | ICD-10-CM | POA: Insufficient documentation

## 2017-06-06 DIAGNOSIS — Y990 Civilian activity done for income or pay: Secondary | ICD-10-CM | POA: Insufficient documentation

## 2017-06-06 DIAGNOSIS — Y9389 Activity, other specified: Secondary | ICD-10-CM | POA: Insufficient documentation

## 2017-06-06 LAB — CBG MONITORING, ED: GLUCOSE-CAPILLARY: 179 mg/dL — AB (ref 65–99)

## 2017-06-06 NOTE — ED Triage Notes (Signed)
Pt arrives via EMS with complaints of lower back pain after a fall on the ice at work yesterday landing on back. Pt reports hx of herniated disk but states the pain is worse since the fall. Pt reports injury is workers comp.
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
## 2017-06-06 NOTE — Discharge Instructions (Signed)
Continue your home medications for pain control.  You can also try applying ice or heat for 15-20 minutes up to 3-4 times a day.  Over-the-counter topical treatment such as lidocaine or capsaicin cream may also help to improve your pain.  Start to stretch as your pain allows.  If your pain does not improve over the next week, you can follow up with pain management.  Your blood sugar was 179 today.  Good control of your blood sugar is important to prevent long-term complications of diabetes.  Please continue to [facility].  If you develop new or worsening symptoms, including loss of control of your bowel or bladder, weakness in the legs, fever, or other concerning symptoms, please return to the emergency department for reevaluation.

## 2017-06-08 ENCOUNTER — Other Ambulatory Visit: Payer: Self-pay

## 2017-06-08 ENCOUNTER — Emergency Department (HOSPITAL_COMMUNITY): Payer: BC Managed Care – PPO

## 2017-06-08 ENCOUNTER — Encounter (HOSPITAL_COMMUNITY): Payer: Self-pay | Admitting: Emergency Medicine

## 2017-06-08 ENCOUNTER — Observation Stay (HOSPITAL_COMMUNITY)
Admission: EM | Admit: 2017-06-08 | Discharge: 2017-06-11 | Disposition: A | Discharge status: Home / Self Care | Payer: BC Managed Care – PPO | Attending: Internal Medicine | Admitting: Internal Medicine

## 2017-06-08 DIAGNOSIS — M25579 Pain in unspecified ankle and joints of unspecified foot: Secondary | ICD-10-CM | POA: Diagnosis not present

## 2017-06-08 DIAGNOSIS — R0989 Other specified symptoms and signs involving the circulatory and respiratory systems: Secondary | ICD-10-CM

## 2017-06-08 DIAGNOSIS — G9340 Encephalopathy, unspecified: Secondary | ICD-10-CM | POA: Diagnosis not present

## 2017-06-08 DIAGNOSIS — Z886 Allergy status to analgesic agent status: Secondary | ICD-10-CM | POA: Insufficient documentation

## 2017-06-08 DIAGNOSIS — Z7984 Long term (current) use of oral hypoglycemic drugs: Secondary | ICD-10-CM

## 2017-06-08 DIAGNOSIS — Z8349 Family history of other endocrine, nutritional and metabolic diseases: Secondary | ICD-10-CM | POA: Diagnosis not present

## 2017-06-08 DIAGNOSIS — Z833 Family history of diabetes mellitus: Secondary | ICD-10-CM

## 2017-06-08 DIAGNOSIS — G8921 Chronic pain due to trauma: Secondary | ICD-10-CM

## 2017-06-08 DIAGNOSIS — G4733 Obstructive sleep apnea (adult) (pediatric): Secondary | ICD-10-CM | POA: Diagnosis not present

## 2017-06-08 DIAGNOSIS — F17290 Nicotine dependence, other tobacco product, uncomplicated: Secondary | ICD-10-CM

## 2017-06-08 DIAGNOSIS — G8929 Other chronic pain: Secondary | ICD-10-CM | POA: Diagnosis not present

## 2017-06-08 DIAGNOSIS — E119 Type 2 diabetes mellitus without complications: Secondary | ICD-10-CM | POA: Diagnosis not present

## 2017-06-08 DIAGNOSIS — Z823 Family history of stroke: Secondary | ICD-10-CM | POA: Diagnosis not present

## 2017-06-08 DIAGNOSIS — W19XXXA Unspecified fall, initial encounter: Secondary | ICD-10-CM | POA: Diagnosis not present

## 2017-06-08 DIAGNOSIS — E669 Obesity, unspecified: Secondary | ICD-10-CM

## 2017-06-08 DIAGNOSIS — R4182 Altered mental status, unspecified: Secondary | ICD-10-CM

## 2017-06-08 DIAGNOSIS — H538 Other visual disturbances: Secondary | ICD-10-CM

## 2017-06-08 DIAGNOSIS — G4700 Insomnia, unspecified: Secondary | ICD-10-CM | POA: Insufficient documentation

## 2017-06-08 DIAGNOSIS — Z79899 Other long term (current) drug therapy: Secondary | ICD-10-CM | POA: Diagnosis not present

## 2017-06-08 DIAGNOSIS — I10 Essential (primary) hypertension: Secondary | ICD-10-CM

## 2017-06-08 DIAGNOSIS — Z87442 Personal history of urinary calculi: Secondary | ICD-10-CM | POA: Diagnosis not present

## 2017-06-08 DIAGNOSIS — Z9889 Other specified postprocedural states: Secondary | ICD-10-CM | POA: Diagnosis not present

## 2017-06-08 DIAGNOSIS — F411 Generalized anxiety disorder: Secondary | ICD-10-CM | POA: Diagnosis not present

## 2017-06-08 DIAGNOSIS — Z91048 Other nonmedicinal substance allergy status: Secondary | ICD-10-CM

## 2017-06-08 DIAGNOSIS — R441 Visual hallucinations: Secondary | ICD-10-CM

## 2017-06-08 DIAGNOSIS — G40909 Epilepsy, unspecified, not intractable, without status epilepticus: Secondary | ICD-10-CM

## 2017-06-08 DIAGNOSIS — Z818 Family history of other mental and behavioral disorders: Secondary | ICD-10-CM | POA: Diagnosis not present

## 2017-06-08 DIAGNOSIS — R51 Headache: Secondary | ICD-10-CM

## 2017-06-08 DIAGNOSIS — E739 Lactose intolerance, unspecified: Secondary | ICD-10-CM | POA: Diagnosis not present

## 2017-06-08 DIAGNOSIS — R440 Auditory hallucinations: Secondary | ICD-10-CM

## 2017-06-08 DIAGNOSIS — F4321 Adjustment disorder with depressed mood: Secondary | ICD-10-CM

## 2017-06-08 DIAGNOSIS — Z8249 Family history of ischemic heart disease and other diseases of the circulatory system: Secondary | ICD-10-CM

## 2017-06-08 DIAGNOSIS — S0003XA Contusion of scalp, initial encounter: Secondary | ICD-10-CM | POA: Diagnosis not present

## 2017-06-08 DIAGNOSIS — Z79891 Long term (current) use of opiate analgesic: Secondary | ICD-10-CM

## 2017-06-08 DIAGNOSIS — E1165 Type 2 diabetes mellitus with hyperglycemia: Secondary | ICD-10-CM

## 2017-06-08 DIAGNOSIS — M545 Low back pain: Secondary | ICD-10-CM | POA: Diagnosis not present

## 2017-06-08 DIAGNOSIS — M25522 Pain in left elbow: Secondary | ICD-10-CM | POA: Diagnosis not present

## 2017-06-08 DIAGNOSIS — Z91011 Allergy to milk products: Secondary | ICD-10-CM

## 2017-06-08 DIAGNOSIS — Z8619 Personal history of other infectious and parasitic diseases: Secondary | ICD-10-CM | POA: Insufficient documentation

## 2017-06-08 DIAGNOSIS — IMO0002 Reserved for concepts with insufficient information to code with codable children: Secondary | ICD-10-CM

## 2017-06-08 LAB — CBC
HCT: 43.8 % (ref 39.0–52.0)
Hemoglobin: 14.8 g/dL (ref 13.0–17.0)
MCH: 29.5 pg (ref 26.0–34.0)
MCHC: 33.8 g/dL (ref 30.0–36.0)
MCV: 87.4 fL (ref 78.0–100.0)
Platelets: 245 K/uL (ref 150–400)
RBC: 5.01 MIL/uL (ref 4.22–5.81)
RDW: 13.4 % (ref 11.5–15.5)
WBC: 12.2 K/uL — ABNORMAL HIGH (ref 4.0–10.5)

## 2017-06-08 LAB — RAPID URINE DRUG SCREEN, HOSP PERFORMED
Amphetamines: NOT DETECTED
Barbiturates: NOT DETECTED
Benzodiazepines: NOT DETECTED
Cocaine: NOT DETECTED
Opiates: NOT DETECTED
Tetrahydrocannabinol: NOT DETECTED

## 2017-06-08 LAB — COMPREHENSIVE METABOLIC PANEL WITH GFR
ALT: 13 U/L — ABNORMAL LOW (ref 17–63)
AST: 31 U/L (ref 15–41)
Albumin: 4.3 g/dL (ref 3.5–5.0)
Alkaline Phosphatase: 56 U/L (ref 38–126)
Anion gap: 14 (ref 5–15)
BUN: 7 mg/dL (ref 6–20)
CO2: 21 mmol/L — ABNORMAL LOW (ref 22–32)
Calcium: 9.5 mg/dL (ref 8.9–10.3)
Chloride: 99 mmol/L — ABNORMAL LOW (ref 101–111)
Creatinine, Ser: 0.87 mg/dL (ref 0.61–1.24)
GFR calc Af Amer: >60 mL/min
GFR calc non Af Amer: >60 mL/min
Glucose, Bld: 205 mg/dL — ABNORMAL HIGH (ref 65–99)
Potassium: 3.9 mmol/L (ref 3.5–5.1)
Sodium: 134 mmol/L — ABNORMAL LOW (ref 135–145)
Total Bilirubin: 0.5 mg/dL (ref 0.3–1.2)
Total Protein: 7.9 g/dL (ref 6.5–8.1)

## 2017-06-08 LAB — DIFFERENTIAL
Basophils Absolute: 0 K/uL (ref 0.0–0.1)
Basophils Relative: 0 %
Eosinophils Absolute: 0 K/uL (ref 0.0–0.7)
Eosinophils Relative: 0 %
Lymphocytes Relative: 9 %
Lymphs Abs: 1.2 K/uL (ref 0.7–4.0)
Monocytes Absolute: 0.9 K/uL (ref 0.1–1.0)
Monocytes Relative: 7 %
Neutro Abs: 10.8 K/uL — ABNORMAL HIGH (ref 1.7–7.7)
Neutrophils Relative %: 84 %

## 2017-06-08 LAB — SALICYLATE LEVEL

## 2017-06-08 LAB — URINALYSIS, ROUTINE W REFLEX MICROSCOPIC
Bilirubin Urine: NEGATIVE
Glucose, UA: 50 mg/dL — AB
Hgb urine dipstick: NEGATIVE
Ketones, ur: 20 mg/dL — AB
Leukocytes, UA: NEGATIVE
Nitrite: NEGATIVE
Protein, ur: NEGATIVE mg/dL
Specific Gravity, Urine: 1.02 (ref 1.005–1.030)
pH: 5 (ref 5.0–8.0)

## 2017-06-08 LAB — MRSA PCR SCREENING: MRSA by PCR: NEGATIVE

## 2017-06-08 LAB — ETHANOL: Alcohol, Ethyl (B): <10 mg/dL

## 2017-06-08 LAB — I-STAT CG4 LACTIC ACID, ED
LACTIC ACID, VENOUS: 3.31 mmol/L — AB (ref 0.5–1.9)
Lactic Acid, Venous: 1.92 mmol/L — ABNORMAL HIGH (ref 0.5–1.9)

## 2017-06-08 LAB — CK: Total CK: 320 U/L (ref 49–397)

## 2017-06-08 LAB — CBG MONITORING, ED: GLUCOSE-CAPILLARY: 191 mg/dL — AB (ref 65–99)

## 2017-06-08 LAB — GLUCOSE, CAPILLARY: Glucose-Capillary: 198 mg/dL — ABNORMAL HIGH (ref 65–99)

## 2017-06-08 LAB — ACETAMINOPHEN LEVEL: Acetaminophen (Tylenol), Serum: <10 ug/mL — ABNORMAL LOW (ref 10–30)

## 2017-06-08 MED ORDER — ENOXAPARIN SODIUM 40 MG/0.4ML ~~LOC~~ SOLN
40.0000 mg | SUBCUTANEOUS | Status: DC
  Administered 2017-06-08 – 2017-06-10 (×3): 40 mg via SUBCUTANEOUS
  Filled 2017-06-08 (×3): qty 0.4

## 2017-06-08 MED ORDER — INSULIN ASPART 100 UNIT/ML ~~LOC~~ SOLN
0.0000 [IU] | Freq: Three times a day (TID) | SUBCUTANEOUS | Status: DC
  Administered 2017-06-08 – 2017-06-09 (×3): 2 [IU] via SUBCUTANEOUS
  Administered 2017-06-09: 3 [IU] via SUBCUTANEOUS
  Administered 2017-06-10: 2 [IU] via SUBCUTANEOUS
  Administered 2017-06-10: 3 [IU] via SUBCUTANEOUS
  Administered 2017-06-10 – 2017-06-11 (×2): 2 [IU] via SUBCUTANEOUS
  Administered 2017-06-11: 3 [IU] via SUBCUTANEOUS

## 2017-06-08 MED ORDER — TRAMADOL HCL 50 MG PO TABS: 50.0000 mg | ORAL_TABLET | Freq: Two times a day (BID) | ORAL | Status: DC | PRN

## 2017-06-08 MED ORDER — ESCITALOPRAM OXALATE 20 MG PO TABS
20.0000 mg | ORAL_TABLET | Freq: Every day | ORAL | Status: DC
  Administered 2017-06-09 – 2017-06-11 (×3): 20 mg via ORAL
  Filled 2017-06-08 (×3): qty 1

## 2017-06-08 MED ORDER — ACETAMINOPHEN 325 MG PO TABS: 650.0000 mg | ORAL_TABLET | Freq: Four times a day (QID) | ORAL | Status: DC | PRN

## 2017-06-08 MED ORDER — ACETAMINOPHEN 650 MG RE SUPP: 650.0000 mg | Freq: Four times a day (QID) | RECTAL | Status: DC | PRN

## 2017-06-08 MED ORDER — SODIUM CHLORIDE 0.9 % IV BOLUS (SEPSIS)
1000.0000 mL | Freq: Once | INTRAVENOUS | Status: AC
  Administered 2017-06-08: 1000 mL via INTRAVENOUS

## 2017-06-08 MED ORDER — PANTOPRAZOLE SODIUM 40 MG PO TBEC
40.0000 mg | DELAYED_RELEASE_TABLET | Freq: Every day | ORAL | Status: DC
  Administered 2017-06-09 – 2017-06-11 (×3): 40 mg via ORAL
  Filled 2017-06-08 (×3): qty 1
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
## 2017-06-08 NOTE — ED Notes (Signed)
Pt was attempting to use urinal and urinated on himself. Condom Cath placed on pt and he was made aware we need urine sample.

## 2017-06-08 NOTE — ED Triage Notes (Signed)
Pt arrived EMS from [employer] found by his neighbors on the ground with a knot on his head, Sluggish gait, and slurred speechpt also found with an empty bottle of hydromorphone filled 12/3 with a 30 day supply as well as other empty bottles without labels. BP 138/82 P 116 SR RR 16 O2% 98 RA 206CBG

## 2017-06-09 ENCOUNTER — Observation Stay (HOSPITAL_BASED_OUTPATIENT_CLINIC_OR_DEPARTMENT_OTHER): Payer: BC Managed Care – PPO

## 2017-06-09 DIAGNOSIS — R55 Syncope and collapse: Secondary | ICD-10-CM | POA: Diagnosis not present

## 2017-06-09 DIAGNOSIS — S0003XA Contusion of scalp, initial encounter: Secondary | ICD-10-CM | POA: Diagnosis not present

## 2017-06-09 DIAGNOSIS — F4321 Adjustment disorder with depressed mood: Secondary | ICD-10-CM | POA: Diagnosis not present

## 2017-06-09 DIAGNOSIS — G9340 Encephalopathy, unspecified: Secondary | ICD-10-CM | POA: Diagnosis not present

## 2017-06-09 DIAGNOSIS — E119 Type 2 diabetes mellitus without complications: Secondary | ICD-10-CM | POA: Diagnosis not present

## 2017-06-09 LAB — COMPREHENSIVE METABOLIC PANEL
ALT: 15 U/L — ABNORMAL LOW (ref 17–63)
AST: 27 U/L (ref 15–41)
Albumin: 3.8 g/dL (ref 3.5–5.0)
Alkaline Phosphatase: 54 U/L (ref 38–126)
Anion gap: 12 (ref 5–15)
BUN: 5 mg/dL — ABNORMAL LOW (ref 6–20)
CHLORIDE: 96 mmol/L — AB (ref 101–111)
CO2: 26 mmol/L (ref 22–32)
Calcium: 9 mg/dL (ref 8.9–10.3)
Creatinine, Ser: 0.73 mg/dL (ref 0.61–1.24)
GFR calc non Af Amer: >60 mL/min (ref >60)
Glucose, Bld: 207 mg/dL — ABNORMAL HIGH (ref 65–99)
POTASSIUM: 3.9 mmol/L (ref 3.5–5.1)
SODIUM: 134 mmol/L — AB (ref 135–145)
Total Bilirubin: 0.7 mg/dL (ref 0.3–1.2)
Total Protein: 7.2 g/dL (ref 6.5–8.1)

## 2017-06-09 LAB — CBC
HCT: 40.3 % (ref 39.0–52.0)
Hemoglobin: 13.6 g/dL (ref 13.0–17.0)
MCH: 29.4 pg (ref 26.0–34.0)
MCHC: 33.7 g/dL (ref 30.0–36.0)
MCV: 87.2 fL (ref 78.0–100.0)
PLATELETS: 219 10*3/uL (ref 150–400)
RBC: 4.62 MIL/uL (ref 4.22–5.81)
RDW: 13.2 % (ref 11.5–15.5)
WBC: 7 10*3/uL (ref 4.0–10.5)

## 2017-06-09 LAB — ECHOCARDIOGRAM COMPLETE
Height: 67 in
Weight: 2992 oz

## 2017-06-09 LAB — GLUCOSE, CAPILLARY
GLUCOSE-CAPILLARY: 188 mg/dL — AB (ref 65–99)
GLUCOSE-CAPILLARY: 188 mg/dL — AB (ref 65–99)
Glucose-Capillary: 176 mg/dL — ABNORMAL HIGH (ref 65–99)
Glucose-Capillary: 193 mg/dL — ABNORMAL HIGH (ref 65–99)
Glucose-Capillary: 242 mg/dL — ABNORMAL HIGH (ref 65–99)

## 2017-06-09 LAB — TROPONIN I: Troponin I: <0.03 ng/mL (ref <0.03)

## 2017-06-09 LAB — HIV ANTIBODY (ROUTINE TESTING W REFLEX): HIV Screen 4th Generation wRfx: NONREACTIVE

## 2017-06-09 MED ORDER — LIDOCAINE 5 % EX PTCH
1.0000 | MEDICATED_PATCH | CUTANEOUS | Status: DC
  Administered 2017-06-09: 1 via TRANSDERMAL
  Filled 2017-06-09 (×3): qty 1

## 2017-06-09 MED ORDER — OXYCODONE HCL 5 MG PO TABS
5.0000 mg | ORAL_TABLET | Freq: Four times a day (QID) | ORAL | Status: DC | PRN
  Administered 2017-06-09 – 2017-06-10 (×3): 5 mg via ORAL
  Filled 2017-06-09 (×3): qty 1

## 2017-06-09 MED ORDER — ONDANSETRON HCL 4 MG/2ML IJ SOLN
4.0000 mg | Freq: Four times a day (QID) | INTRAMUSCULAR | Status: DC | PRN
  Administered 2017-06-09: 4 mg via INTRAVENOUS
  Filled 2017-06-09: qty 2

## 2017-06-09 MED ORDER — PROPRANOLOL HCL 10 MG PO TABS
10.0000 mg | ORAL_TABLET | Freq: Three times a day (TID) | ORAL | Status: DC
  Administered 2017-06-09 – 2017-06-11 (×5): 10 mg via ORAL
  Filled 2017-06-09 (×8): qty 1

## 2017-06-09 MED ORDER — ONDANSETRON HCL 4 MG/2ML IJ SOLN: 4.0000 mg | Freq: Four times a day (QID) | INTRAMUSCULAR | Status: DC | PRN

## 2017-06-09 MED ORDER — IRBESARTAN 300 MG PO TABS
150.0000 mg | ORAL_TABLET | Freq: Every day | ORAL | Status: DC
  Administered 2017-06-09 – 2017-06-11 (×3): 150 mg via ORAL
  Filled 2017-06-09 (×3): qty 1

## 2017-06-09 MED ORDER — OXYCODONE HCL 5 MG PO TABS
5.0000 mg | ORAL_TABLET | Freq: Four times a day (QID) | ORAL | Status: AC | PRN
Start: 2017-06-09 — End: 2017-06-09
  Administered 2017-06-09 (×2): 5 mg via ORAL
  Filled 2017-06-09 (×2): qty 1

## 2017-06-09 NOTE — Progress Notes (Signed)
Paged by RN at 12:05am for 10/10 back pain. Patient was admitted earlier today with encephalopathy and concern for narcotic overdose. He is on chronic narcotics which [facility]. Verified prescriptions through [place] controlled substance database. Patient fills hydrocodone-acetaminophen 5-325 (#90 tabs), Ambien, and Soma every month, most recently on 12/3. He has been admitted three times previously for drug overdose / intoxication. It has now been 12-24 hours since last taking his pain medication. Per RN, he is alert and oriented and encephalopathy had resolved. There are no signs of opioid withdrawal aside from tachycardia (HR 108) while at rest. Per request of day team, we are avoiding tylenol due to concern for possible acetaminophen toxicity. He has an allergy listed to NSAIDs (hives). This is a tenuous clinical situation given his chronic pain, overdose, but also concern for potential withdrawal. We will give oxycodone 5 mg alone without tylenol q6h x 2 doses. Continue to monitor for signs of opioid withdrawal (tachycardia, diaphoresis, tremor, GI upset, dilated pupils, restlessness).
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
## 2017-06-09 NOTE — Progress Notes (Signed)
Echocardiogram 2D Echocardiogram has been performed.  Jason PartridgeBrooke S Siddarth [name] 06/09/2017, 3:28 PM

## 2017-06-09 NOTE — Progress Notes (Signed)
Pt received oxycodone 5 mg as described in prior night team progress note by Dr. Antony ContrasGuilloud. The pt continues to complain of back pain, side pain, and headache not adequately relieved by oxycodone. Also feels there may be a component of muscle tightness contributing to his pain. As previously mentioned, control of his chronic pain is complicated by his presentation for potential overdose. At this point, will avoid further sedating medications that may have been involved in his overdose such as his home muscle relaxants. Also avoiding tylenol as his home norco contains acetaminophen and we are unsure of amount ingested at this point. In addition, he has a documented allergy to Ibuprofen with hives reaction. Will attempt topical lidocaine patch for further relief at this point.

## 2017-06-09 NOTE — Progress Notes (Signed)
Nutrition Brief Note  Patient identified on the Malnutrition Screening Tool (MST) Report.  Per readings below, pt's weight has been stable since March 2018.  Wt Readings from Last 15 Encounters:  06/08/17 187 lb (84.8 kg)  06/06/17 187 lb (84.8 kg)  09/13/16 192 lb (87.1 kg)  09/04/16 190 lb (86.2 kg)  01/25/16 205 lb (93 kg)  12/03/15 204 lb (92.5 kg)  11/29/15 206 lb (93.4 kg)  11/25/15 206 lb (93.4 kg)  08/26/15 208 lb (94.3 kg)  07/22/15 262 lb (118.8 kg)  05/14/15 202 lb (91.6 kg)  03/15/15 200 lb 1.9 oz (90.8 kg)  03/08/15 206 lb 9.1 oz (93.7 kg)   Body mass index is 29.29 kg/m. Patient meets criteria for Overweight based on current BMI.   Current diet order is Carbohydrate Modified, patient is consuming approximately 100% of meals at this time. Labs and medications reviewed.   If nutrition issues arise, please consult RD.   Maureen ChattersKatie Jawaan Adachi, RD, LDN Pager #: [phone] After-Hours Pager #: [phone]
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
## 2017-06-10 ENCOUNTER — Other Ambulatory Visit: Payer: Self-pay

## 2017-06-10 DIAGNOSIS — R4182 Altered mental status, unspecified: Secondary | ICD-10-CM

## 2017-06-10 DIAGNOSIS — Z818 Family history of other mental and behavioral disorders: Secondary | ICD-10-CM | POA: Diagnosis not present

## 2017-06-10 DIAGNOSIS — R440 Auditory hallucinations: Secondary | ICD-10-CM | POA: Diagnosis not present

## 2017-06-10 DIAGNOSIS — F5104 Psychophysiologic insomnia: Secondary | ICD-10-CM

## 2017-06-10 DIAGNOSIS — F3340 Major depressive disorder, recurrent, in remission, unspecified: Secondary | ICD-10-CM

## 2017-06-10 DIAGNOSIS — I10 Essential (primary) hypertension: Secondary | ICD-10-CM | POA: Diagnosis not present

## 2017-06-10 DIAGNOSIS — M549 Dorsalgia, unspecified: Secondary | ICD-10-CM

## 2017-06-10 DIAGNOSIS — Z79899 Other long term (current) drug therapy: Secondary | ICD-10-CM | POA: Diagnosis not present

## 2017-06-10 DIAGNOSIS — F4321 Adjustment disorder with depressed mood: Secondary | ICD-10-CM

## 2017-06-10 DIAGNOSIS — F17210 Nicotine dependence, cigarettes, uncomplicated: Secondary | ICD-10-CM | POA: Diagnosis not present

## 2017-06-10 DIAGNOSIS — G4700 Insomnia, unspecified: Secondary | ICD-10-CM

## 2017-06-10 DIAGNOSIS — F22 Delusional disorders: Secondary | ICD-10-CM

## 2017-06-10 DIAGNOSIS — G8929 Other chronic pain: Secondary | ICD-10-CM

## 2017-06-10 DIAGNOSIS — Z79891 Long term (current) use of opiate analgesic: Secondary | ICD-10-CM | POA: Diagnosis not present

## 2017-06-10 DIAGNOSIS — G9340 Encephalopathy, unspecified: Secondary | ICD-10-CM | POA: Diagnosis not present

## 2017-06-10 DIAGNOSIS — E119 Type 2 diabetes mellitus without complications: Secondary | ICD-10-CM | POA: Diagnosis not present

## 2017-06-10 DIAGNOSIS — Z7984 Long term (current) use of oral hypoglycemic drugs: Secondary | ICD-10-CM | POA: Diagnosis not present

## 2017-06-10 LAB — GLUCOSE, CAPILLARY
GLUCOSE-CAPILLARY: 233 mg/dL — AB (ref 65–99)
GLUCOSE-CAPILLARY: 196 mg/dL — AB (ref 65–99)
Glucose-Capillary: 181 mg/dL — ABNORMAL HIGH (ref 65–99)
Glucose-Capillary: 194 mg/dL — ABNORMAL HIGH (ref 65–99)

## 2017-06-10 MED ORDER — LORAZEPAM 1 MG PO TABS: 1.0000 mg | ORAL_TABLET | Freq: Four times a day (QID) | ORAL | Status: DC | PRN

## 2017-06-10 MED ORDER — VITAMIN B-1 100 MG PO TABS
100.0000 mg | ORAL_TABLET | Freq: Every day | ORAL | Status: DC
  Administered 2017-06-10 – 2017-06-11 (×2): 100 mg via ORAL
  Filled 2017-06-10 (×2): qty 1

## 2017-06-10 MED ORDER — ADULT MULTIVITAMIN W/MINERALS CH
1.0000 | ORAL_TABLET | Freq: Every day | ORAL | Status: DC
  Administered 2017-06-10 – 2017-06-11 (×2): 1 via ORAL
  Filled 2017-06-10 (×2): qty 1

## 2017-06-10 MED ORDER — LORAZEPAM 2 MG/ML IJ SOLN: 1.0000 mg | Freq: Four times a day (QID) | INTRAMUSCULAR | Status: DC | PRN

## 2017-06-10 MED ORDER — THIAMINE HCL 100 MG/ML IJ SOLN
100.0000 mg | Freq: Every day | INTRAMUSCULAR | Status: DC
  Filled 2017-06-10: qty 2

## 2017-06-10 MED ORDER — FOLIC ACID 1 MG PO TABS
1.0000 mg | ORAL_TABLET | Freq: Every day | ORAL | Status: DC
  Administered 2017-06-10 – 2017-06-11 (×2): 1 mg via ORAL
  Filled 2017-06-10 (×2): qty 1

## 2017-06-10 NOTE — Progress Notes (Signed)
During the shift, security came to the unit asking for pt stating pt made a phone call to 911 stating there was music playing outside of his room and a commotion. Writer went to ask pt if he had called 911, at first he stated no, then went to the bathroom in his room and refused to come out, stating he did not feel safe and would not confirm if he had called 911. On further investigation, pt stated he had called 911 because he heard something outside his room and did not feel like he could use his call bell for assistance or let anyone know what was going on. Pt appears to be having hallucinations as there was no music playing on the unit and no commotion or loud noises on the unit. On-call provider [place] notified, pt is not on PTA narcotics and may be going through withdrawals. CIWA ordered. Will continue to monitor Jason CorwinDana C Lenor Provencher, RN 06/10/17 7:42 AM

## 2017-06-10 NOTE — Progress Notes (Signed)
Paged by nursing around 5 AM that patient was having auditory hallucinations. [place] [employer] and locked himself in the bathroom.   I went to the bedside to evaluate the patient. He was resting in bed in no acute distress. He acknowledged that he should not have called the police. He states that he heard a conservation in the hall that alerted him to call them. He then heard his name being yelled in the hallway and went to the bathroom to hide. He expressed that he is trained in active shooter precautions and that is why he went to lock himself in the bathroom. Patient states that he has not had a drink of alcohol since December 2017 but he has not slept in >96 hours. This is not unusual for him. He also endorses that ability to hear and smell things most people cannot.   On PE the patient is hypertensive but his HR and oxygen saturation are within normal limits. He does not exhibit any tremor or diaphoresis. He is not agitated or nervous but does appear to have some questionable auditory hallucinations and minor grandiose ideas. His speech is not pressured and his thought process is linear.   At this point substance withdrawal seems less likely (EtOH, Opiate) but it is possible he is having some benzo withdrawal; however these would be atypical withdrawal features. His presentation more fits with hypomania. Given the patient's history of depression he may be Bipolar Type II. Patient is not currently suicidal or homicidal. Placed on CIWA precautions.
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
## 2017-06-10 NOTE — Progress Notes (Signed)
Jason BoroughsGordon [name] is a 46 y.o. male patient admitted from ED awake, alert - oriented  X 4 - no acute distress noted.  VSS - Blood pressure (!) 145/94, pulse 87, temperature 98 F (36.7 C), temperature source Oral, resp. rate 18, height 5\' 7"  (1.702 m), weight 84.8 kg (187 lb), SpO2 96 %.    IV in place, occlusive dsg intact without redness.  Orientation to room, and floor completed with information packet given to patient/family.  Patient declined safety video at this time.  Admission INP armband ID verified with patient/family, and in place.   SR up x 2, fall assessment complete, with patient and family able to verbalize understanding of risk associated with falls, and verbalized understanding to call nsg before up out of bed.  Call light within reach, patient able to voice, and demonstrate understanding.  Skin assessed and documented accordingly. No evidence of skin break down noted on exam.    Will cont to eval and treat per MD orders.  Caren HazyKamila A Scherrie Seneca, RN 06/10/2017 11:13 AM

## 2017-06-10 NOTE — Progress Notes (Signed)
Subjective:  Per Dr. Reginal LutesHelberg's earlier note, patient had called 911 this morning.  On my conversation with Mr. Jason [name] he states that overnight he heard music playing continuously on the ward, then he noticed someone had walked by his room (he presumed a male) which by itself alerted him, and heard someone state that they have 3 felonies. This prompted him to call 911 as he was afraid for his safety and that someone was going to come in his hospital room to harm him. He then reports someone yelling his name at the end of the ward which prompted him to go to his restroom to hide out.   Patient reports that he has been hearing music most of the day yesterday, overnight and currently playing outside his room. He also endorses music playing from an inactive camera in his room ([facility] and that he knew someone was watching him wash up through that camera yesterday.   He also stated that someone came in this morning and said they smelled EtOH on him.  He states he hasn't slept in 72 hours; this is not usual for him even with his chronic insomnia.   Objective:  Vital signs in last 24 hours: Vitals:   06/10/17 0600 06/10/17 0747 06/10/17 0800 06/10/17 1008  BP: (!) 172/105  (!) 172/108 (!) 163/101  Pulse: 84  87 96  Resp: (!) 22  (!) 23   Temp:  99.3 F (37.4 C)    TempSrc:  Oral    SpO2: 98%  99%   Weight:      Height:       General: Laying in bed comfortably, mildly anxious appearing Cardiac: RRR, No R/M/G appreciated Pulm: normal effort, CTAB Abd: soft, non tender, non distended, BS normal Ext: extremities well perfused, no peripheral edema Neuro: alert and oriented X3, cranial nerves II-XII grossly intact, no focal neurological deficits  Psych: somewhat tangential and pressured speech  Assessment/Plan:  Active Problems:   Encephalopathy acute  Acute Encephalopathy Unlikely due to acute infection as patient has had no symptoms and there is no other evidence for  infection. Labs revealed normal ethanol, tylenol, and salicylate levels.  Rapid UDS performed in ED was negative, but cannot detect hydrocodone, oxycodone, etc. Although labs resulted negative, there is still high suspicion for medication over use, which has resulted in his intermittent confusion and subsequent fall with loss of consciousness. It is also highly possible that the combination of SOMA, hydrocodone, and trazodone caused the patients encephalopathy. Patient denied history of seizure disorder, but this still remains a possibility. CK level was WNL. Unlikely TIA or CVA, patient with no focal neurological deficits. Will rule out cardiac source of syncope with ECHO. Troponin x 1 negative, with not acute ischemic changes on EKG so ACS/MI not likely. Echo shows preserved EF, G1DD, no structural abnormalities. --patient advised to stop trazodone on discharge --patient advised to take meds only as prescribed and work with pain management on possibly lowering doses if able  Auditory hallucinations, paranoia New onset this admission. Patient denies EtOH use (stating last use was a year ago), denies benzo use (last clonazepam 6 months ago). He does use Soma long term and we've been holding while inpatient due to initial presentation - withdrawal from this may cause irritability, insomnia, and anxiety but should not cause hallucinations. He does have a history of MDD; psychotic features or hypomania may play a role here.  --consulted psych - patient in agreement with evaluation --CIWA monitoring  Essential Hypertension BP consistently elevated since admission.  -Irbesartan 150 mg, restarted propranolol this AM  Diabetes Mellitus Type 2 The patient is currently taking metformin 1000mg  bid. His last HbA1c=12.1. The patient's blood glucose on admission has been ranging 190-200.  -SSI -CBG monitoring  Chronic Pain disorder, Long term opioid use [facility].  Patient takes SOMA and Hydrocodone for pain. Last filled Hydrocodone on 12/3 for 90 tablets. Patient states he stashes his medications around his home so they are less easy to steal. He is not sure if he took more than usual. Typically takes 3 a day of hydrocodone and SOMA as needed.  -Oxycodone 5 mg IR every 6 hours for severe pain  -Holding other sedating medications at this time  Dispo: Anticipated discharge pending psych Marlise Eveseval.   Tabita Corbo, MD 06/10/2017, 10:18 AM Pager: [phone]

## 2017-06-11 DIAGNOSIS — Z79891 Long term (current) use of opiate analgesic: Secondary | ICD-10-CM | POA: Diagnosis not present

## 2017-06-11 DIAGNOSIS — G9340 Encephalopathy, unspecified: Secondary | ICD-10-CM | POA: Diagnosis not present

## 2017-06-11 DIAGNOSIS — Z91048 Other nonmedicinal substance allergy status: Secondary | ICD-10-CM | POA: Diagnosis not present

## 2017-06-11 DIAGNOSIS — R441 Visual hallucinations: Secondary | ICD-10-CM | POA: Diagnosis not present

## 2017-06-11 DIAGNOSIS — Z7984 Long term (current) use of oral hypoglycemic drugs: Secondary | ICD-10-CM | POA: Diagnosis not present

## 2017-06-11 DIAGNOSIS — E119 Type 2 diabetes mellitus without complications: Secondary | ICD-10-CM | POA: Diagnosis not present

## 2017-06-11 DIAGNOSIS — Z91011 Allergy to milk products: Secondary | ICD-10-CM | POA: Diagnosis not present

## 2017-06-11 DIAGNOSIS — R440 Auditory hallucinations: Secondary | ICD-10-CM | POA: Diagnosis not present

## 2017-06-11 DIAGNOSIS — Z886 Allergy status to analgesic agent status: Secondary | ICD-10-CM | POA: Diagnosis not present

## 2017-06-11 LAB — GLUCOSE, CAPILLARY
Glucose-Capillary: 185 mg/dL — ABNORMAL HIGH (ref 65–99)
Glucose-Capillary: 226 mg/dL — ABNORMAL HIGH (ref 65–99)

## 2017-06-11 NOTE — Progress Notes (Signed)
CSW placed Behavioral Health Outpatient resources on patient's AVS for patient to call and schedule an appointment.  CSW signing off.  Osborne Cascoadia Krysteena Stalker LCSWA [phone]

## 2017-06-11 NOTE — Progress Notes (Signed)
Jason BoroughsGordon [name] to be D/C'd Home per MD order.  Discussed with the patient and all questions fully answered.  VSS, Skin clean, dry and intact without evidence of skin break down, no evidence of skin tears noted. IV catheter discontinued intact. Site without signs and symptoms of complications. Dressing and pressure applied.  An After Visit Summary was printed and given to the patient. Work noted given to patient.   D/c education completed with patient/family including follow up instructions, medication list, d/c activities limitations if indicated, with other d/c instructions as indicated by MD - patient able to verbalize understanding, all questions fully answered.   Patient instructed to return to ED, call 911, or call MD for any changes in condition.   Patient escorted via WC, and D/C home via private auto.  Lennox SoldersShelia Wadie Liew, RN 06/11/2017 5:01 PM

## 2017-06-11 NOTE — Progress Notes (Signed)
   Subjective:  Patient states he feels back to his baseline this morning. He denied recurrence of auditory or visual hallucinations overnight. He denied anxiety or paranoia. He believes it was due to not taking his typical medications (SOMA). He is interested in outpatient psychiatry referral.   Objective:  Vital signs in last 24 hours: Vitals:   06/10/17 2118 06/10/17 2350 06/11/17 0500 06/11/17 0620  BP: (!) 155/103 (!) 155/96 (!) 153/100 (!) 164/99  Pulse: 74 71 70 71  Resp: (!) 23  18 16   Temp: 98.2 F (36.8 C)  (!) 97.5 F (36.4 C)   TempSrc: Oral  Oral   SpO2: 98%  97% 98%  Weight:      Height:       General: Laying in bed comfortably, NAD Cardiac: RRR, No R/M/G appreciated Pulm: normal effort, CTAB Abd: soft, non tender, non distended, BS normal Ext: extremities well perfused, no peripheral edema Neuro: alert and oriented X3, cranial nerves II-XII grossly intact, no focal neurological deficits  Psych: mood and affect normal  Assessment/Plan:  Principal Problem:   Adjustment disorder with depressed mood Active Problems:   Encephalopathy acute  Acute Encephalopathy highly possible that the combination and overuse of SOMA, hydrocodone, and trazodone caused the patients encephalopathy. Infection was ruled out and the cardiac work up for syncope was not revealing. Troponin x 1 negative, no acute ischemic changes on EKG so ACS/MI not likely. Echo shows preserved EF, G1DD, no structural abnormalities.  -patient advised to stop trazodone on discharge -patient advised to take meds only as prescribed and work with pain management on possibly lowering doses if able -patient instructed to follow up with PCP with 1-2 weeks of d/c  Auditory hallucinations, paranoia New onset this admission. Now resolved. Psych did not feel this was psychosis or delusions, and believed the patient was confused s/p fall and medication overuse. He does use Soma long term and we've been holding while  inpatient due to initial presentation - withdrawal from this may cause irritability, insomnia, and anxiety but should not cause hallucinations. He does have a history of MDD; possible psychotic features or hypomania. Warrants outpatient psych referral.   -Outpatient psych referral will be placed, will consult care management   Essential Hypertension BP consistently elevated since admission.  -Irbesartan 150 mg, restarted propranolol  -will continue at discharge   Diabetes Mellitus Type 2 The patient is currently taking metformin 1000mg  bid. His last HbA1c=12.1. The patient's blood glucose on admission has been ranging 190-200.  -SSI -CBG monitoring -will need repeat A1c with PCP  Chronic Pain disorder, Long term opioid use [facility]. Patient takes SOMA and Hydrocodone for pain. Last filled Hydrocodone on 12/3 for 90 tablets.Typically takes 3 a day of hydrocodone and SOMA as needed. Patients pain has been well controlled with Oxycodone 5 mg IR every 6 hours for severe pain. -Discussed with patient the importance of discussing medications with pain clinic   Dispo: Anticipated discharge today.   Toney RakesLacroce, Veralyn Lopp J, MD 06/11/2017, 11:39 AM Pager: [phone]

## 2017-06-13 NOTE — Discharge Summary (Signed)
Name: Jason [name] MRN: [id number] DOB: [birth year] 46 y.o. PCP: [facility]  Date of Admission: 06/08/2017 12:03 PM Date of Discharge: 06/11/2017 Attending Physician: No att. providers found  Discharge Diagnosis: 1. Acute Encephalopathy   Principal Problem:   Adjustment disorder with depressed mood Active Problems:   Encephalopathy acute   Discharge Medications: Allergies as of 06/11/2017      Reactions   Ibuprofen Hives   Lactose Intolerance (gi) Other (See Comments)   Gi Upset   Tape Itching, Rash, Other (See Comments)   Paper tape      Medication List    STOP taking these medications   ALPRAZolam 1 MG tablet Commonly known as:  XANAX   tiZANidine 4 MG tablet Commonly known as:  ZANAFLEX   traMADol 50 MG tablet Commonly known as:  ULTRAM   zolpidem 10 MG tablet Commonly known as:  AMBIEN     TAKE these medications   acetaminophen 500 MG tablet Commonly known as:  TYLENOL Take 1 tablet (500 mg total) by mouth every 6 (six) hours as needed. What changed:  reasons to take this   carisoprodol 350 MG tablet Commonly known as:  SOMA Take 1 tablet (350 mg total) by mouth 3 (three) times daily.   escitalopram 20 MG tablet Commonly known as:  LEXAPRO Take 1 tablet (20 mg total) by mouth daily.   gabapentin 100 MG capsule Commonly known as:  NEURONTIN TAKE TWO CAPSULES BY MOUTH TWICE DAILY What changed:    how much to take  how to take this  when to take this   glyBURIDE 5 MG tablet Commonly known as:  DIABETA Take 1 tablet (5 mg total) by mouth 2 (two) times daily.   HYDROcodone-acetaminophen 5-325 MG tablet Commonly known as:  NORCO/VICODIN Take 1 tablet by mouth every 8 (eight) hours as needed for moderate pain.   metFORMIN 1000 MG tablet Commonly known as:  GLUCOPHAGE TAKE ONE TABLET BY MOUTH TWICE DAILY WITH  A MEAL   omeprazole 20 MG capsule Commonly known as:  PRILOSEC Take 1 capsule (20 mg total) by mouth daily.     propranolol 10 MG tablet Commonly known as:  INDERAL Take 1 tablet (10 mg total) by mouth 3 (three) times daily.   valsartan 160 MG tablet Commonly known as:  DIOVAN TAKE ONE TABLET BY MOUTH ONCE DAILY What changed:    how much to take  how to take this  when to take this       Disposition and follow-up:   [name] was discharged from [facility] in Stable condition.  At the hospital follow up visit please address:  1.  Acute Encephalopathy  - Infection ruled out as cause - EKG and Troponin were negative, ACS/MI ruled out -Transthoracic echo negative for structural abnormality - No seizure history, HPI not consistent with seizure, no witnessed seizures while hospitalized -Telemetry without arrhythmia - All sedating and CNS medications were held and the patient improved, maintained on oxy ir 5 mg for pain -Difficult to obtain clear history but seems like his encephalopathy was due to medication over use and/or medication interactions (percocet, soma, trazodone), without intent to harm himself -Please assess all medications as he has had other episodes in the past similar to this; suggested stopping trazodone on discharge  Paranoia, Hallucinations -Episode of visual and auditory hallucinations that resolved spontaneously -Psychiatry was consulted and evaluated the patient -Psychiatry follow up? May need evaluation for psychosis or schizophrenia -Patient previously  saw psychiatrist in June, was instructed to return to that psychiatrist -Continued on Lexapro 20 mg  Type 2 Diabetes Mellitus  -Will need A1C at follow up -Blood glucose not in range  -Continued metformin and glyburide at discharge   2.  Labs / imaging needed at time of follow-up: None  3.  Pending labs/ test needing follow-up: none   Follow-up Appointments: Follow-up Information    [facility]. Call.   Specialty:  [facility] Contact  information: [address] [place] [address] [phone]        [facility]. Call.   Specialty:  [facility] Contact information: [address] [place] [address] [phone]        [facility]   Specialty:  Pension scheme managerrofessional Counselor Contact information: [facility] [address] [place] [address] [phone]           Hospital Course by problem list: Principal Problem:   Adjustment disorder with depressed mood Active Problems:   Encephalopathy acute   1. Acute Encephalopathy, Paranoia, Hallucinations Mr. Jason [name] was [facility] to [facility] for acute encephalopathy after being found down in his apartment with various empty pill bottles. In the ED, the patients vital signs were stable. He was afebrile and without laboratory abnormalities. EKG and Troponin were negative for evidence of ACS or acute MI. CT scan of the head and neck were negative for acute cervical or intracranial abnormality and the patient was without neurological deficits. The patient had difficulty recollecting the event. He denied intentional over use of medication. He was monitored on telemetry and had a transthoracic echocardiogram to evaluate for structural abnormalities (see results below). On the second night of admission the patient experienced auditory and visual hallucination with paranoia. Psychiatry was consulted and did not feel the patient was in active psychosis or a danger to himself or others. Psychiatry recommended continuation of current SSRI. The day of discharge the patient denied recurrence of hallucinations or paranoia. Infection, stroke, and acute coronary syndrome or structural heart disease were ruled out as the patient's cause of encephalopathy. This was most likely due to medication overuse and/or interaction of medications.   2. Type 2 Diabetes Patients  blood glucose was monitored and he remained on an insulin sliding scale during hospitalization. Blood glucose typically ranged from 170-200.   Discharge Vitals:   BP (!) 145/93 (BP Location: Left Arm)   Pulse 83   Temp 98.2 F (36.8 C) (Oral)   Resp 16   Ht 5\' 7"  (1.702 m)   Wt 187 lb (84.8 kg)   SpO2 95%   BMI 29.29 kg/m   Pertinent Labs, Studies, and Procedures:  CBC Latest Ref Rng & Units 06/09/2017 06/08/2017 09/08/2016  WBC 4.0 - 10.5 K/uL 7.0 12.2(H) 8.1  Hemoglobin 13.0 - 17.0 g/dL 24.413.6 01.014.8 27.214.4  Hematocrit 39.0 - 52.0 % 40.3 43.8 43.1  Platelets 150 - 400 K/uL 219 245 204   BMP Latest Ref Rng & Units 06/09/2017 06/08/2017 09/08/2016  Glucose 65 - 99 mg/dL 536(U207(H) 440(H205(H) 474(Q323(H)  BUN 6 - 20 mg/dL 5(L) 7 10  Creatinine 5.950.61 - 1.24 mg/dL 6.380.73 7.560.87 4.330.77  Sodium 135 - 145 mmol/L 134(L) 134(L) 136  Potassium 3.5 - 5.1 mmol/L 3.9 3.9 3.6  Chloride 101 - 111 mmol/L 96(L) 99(L) 98(L)  CO2 22 - 32 mmol/L 26 21(L) 28  Calcium 8.9 - 10.3 mg/dL 9.0  9.5 8.9   CT Scan of Head and Cervical Spine   Alignment: Normal. Skull base and vertebrae: No acute fracture. No primary bone lesion or focal pathologic process. Soft tissues and spinal canal: No prevertebral fluid or swelling. No visible canal hematoma. Disc levels: No focal disc herniation, central canal stenosis or significant neural foraminal encroachment. Upper chest: Negative. Other: None IMPRESSION: 1. Right posterior parietal scalp contusion with small scalp hematomas. No skull fracture. 2. No acute intracranial abnormality. 3. Normal cervical spine CT.  Transthoracic Echocardiogram Study Conclusions - Left ventricle: The cavity size was normal. Wall thickness was   normal. Systolic function was normal. The estimated ejection   fraction was in the range of 60% to 65%. Although no diagnostic   regional wall motion abnormality was identified, this possibility   cannot be completely excluded on the basis of this study.  Doppler   parameters are consistent with abnormal left ventricular   relaxation (grade 1 diastolic dysfunction). - Aortic valve: There was no stenosis. - Mitral valve: There was trivial regurgitation. - Tricuspid valve: Peak RV-RA gradient (S): 18 mm Hg. - Pulmonary arteries: PA peak pressure: 21 mm Hg (S). - Inferior vena cava: The vessel was normal in size. The   respirophasic diameter changes were in the normal range (>= 50%),   consistent with normal central venous pressure. Impressions: - Normal LV size with EF 60-65%. Normal RV size and systolic   function. No significant valvular abnormalities.   Discharge Instructions: Discharge Instructions    Ambulatory referral to Psychiatry   Complete by:  As directed    Patient experienced auditory and visual hallucinations while hospitalized. 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   Call MD for:  difficulty breathing, headache or visual disturbances   Complete by:  As directed    Call MD for:  persistant dizziness or light-headedness   Complete by:  As directed    Diet - low sodium heart healthy   Complete by:  As directed    Diet - low sodium heart healthy   Complete by:  As directed    Discharge instructions   Complete by:  As directed    Mr. Jason [name],   [facility] for altered mental status and a fall. We believe your medications may be interacting to cause you to feel confused and disoriented. I would recommend telling your primary care doctor and pain clinic doctor. Please take all medications as prescribed, as overuse of medicine can have dangerous side effects. Hydrocodone in too high of a dose can cause your lungs and heart to slow down to a dangerous rate that can cause death. I would also recommned you stop taking the Trazodone as this was the only medication that was recently changed and could have played a part in your confusion and fall.   The ultrasound or echocardiogram of your heart was normal.  Your heart's pump function is normal and there were no structural abnormalities that may have contributed to loss of consciousness and fall.   You were seen by psychiatrist Dr. Burnard LeighAlexander Arya Eksir, MD in July 2018. You can return to this psychiatrist, which would be best as he already knows you. Please call the office of Dr. Gaspar SkeetersEskir at [phone]. We highly recommend you follow up with a psychiatrist to be evaluated. If you do not want to return to this physcian a referral has been placed. The visual and auditory hallucinations you experienced in the hospital may  have been due to confusion, but you should be evaluated for other causes.   Increase activity slowly   Complete by:  As directed    Increase activity slowly   Complete by:  As directed       Signed: Toney Rakes, MD 06/13/2017, 3:08 PM   Pager: [phone]

## 2022-09-16 NOTE — Telephone Encounter (Signed)
Location of patient: [place]    Subjective: Caller states "My husband is having back pain"     Current Symptoms: Back pain, near shoulder area, numbness in tip of tongue    Onset: 2 days ago; rapid    Associated Symptoms: irritability , fussiness    Pain Severity: 7/10; sharp; constant    Temperature: Denies fever     What has been tried: Pain patches, tylenol    Recommended disposition: Go to ED Now    Care advice provided, patient verbalizes understanding; denies any other questions or concerns; instructed to call back for any new or worsening symptoms.    Patient/caller agrees to proceed to local Emergency Department    Attention Provider:  Thank you for allowing me to participate in the care of your patient.  The patient was connected to triage in response to symptoms provided.   Please do not respond through this encounter as the response is not directed to a shared pool.    Reason for Disposition   Patient sounds very sick or weak to the triager    Protocols used: Back Pain-ADULT-AH

## 2022-09-18 ENCOUNTER — Inpatient Hospital Stay
Admit: 2022-09-18 | Discharge: 2022-09-18 | Disposition: A | Discharge status: Home / Self Care | Payer: PRIVATE HEALTH INSURANCE | Attending: Student in an Organized Health Care Education/Training Program

## 2022-09-18 ENCOUNTER — Emergency Department: Admit: 2022-09-18 | Payer: PRIVATE HEALTH INSURANCE

## 2022-09-18 DIAGNOSIS — M546 Pain in thoracic spine: Secondary | ICD-10-CM

## 2022-09-18 LAB — CBC WITH AUTO DIFFERENTIAL
Basophils %: 0 % (ref 0–1)
Basophils Absolute: 0 10*3/uL (ref 0.0–0.1)
Eosinophils %: 1 % (ref 0–7)
Eosinophils Absolute: 0.1 10*3/uL (ref 0.0–0.4)
Hematocrit: 37.4 % (ref 36.6–50.3)
Hemoglobin: 11.9 g/dL — ABNORMAL LOW (ref 12.1–17.0)
Immature Granulocytes %: 1 % — ABNORMAL HIGH (ref 0.0–0.5)
Immature Granulocytes Absolute: 0 10*3/uL (ref 0.00–0.04)
Lymphocytes %: 18 % (ref 12–49)
Lymphocytes Absolute: 1.1 10*3/uL (ref 0.8–3.5)
MCH: 30.2 PG (ref 26.0–34.0)
MCHC: 31.8 g/dL (ref 30.0–36.5)
MCV: 94.9 FL (ref 80.0–99.0)
MPV: 8.7 FL — ABNORMAL LOW (ref 8.9–12.9)
Monocytes %: 5 % (ref 5–13)
Monocytes Absolute: 0.3 10*3/uL (ref 0.0–1.0)
Neutrophils %: 75 % (ref 32–75)
Neutrophils Absolute: 4.4 10*3/uL (ref 1.8–8.0)
Nucleated RBCs: 0 PER 100 WBC
Platelets: 267 10*3/uL (ref 150–400)
RBC: 3.94 M/uL — ABNORMAL LOW (ref 4.10–5.70)
RDW: 14.3 % (ref 11.5–14.5)
WBC: 5.9 10*3/uL (ref 4.1–11.1)
nRBC: 0 10*3/uL (ref 0.00–0.01)

## 2022-09-18 LAB — COMPREHENSIVE METABOLIC PANEL
ALT: 17 U/L (ref 12–78)
AST: 12 U/L — ABNORMAL LOW (ref 15–37)
Albumin/Globulin Ratio: 1 — ABNORMAL LOW (ref 1.1–2.2)
Albumin: 3.5 g/dL (ref 3.5–5.0)
Alk Phosphatase: 58 U/L (ref 45–117)
Anion Gap: 7 mmol/L (ref 5–15)
BUN/Creatinine Ratio: 18 (ref 12–20)
BUN: 16 MG/DL (ref 6–20)
CO2: 25 mmol/L (ref 21–32)
Calcium: 8.7 MG/DL (ref 8.5–10.1)
Chloride: 104 mmol/L (ref 97–108)
Creatinine: 0.88 MG/DL (ref 0.70–1.30)
Est, Glom Filt Rate: >90 mL/min/{1.73_m2} (ref >60)
Globulin: 3.6 g/dL (ref 2.0–4.0)
Glucose: 262 mg/dL — ABNORMAL HIGH (ref 65–100)
Potassium: 3.9 mmol/L (ref 3.5–5.1)
Sodium: 136 mmol/L (ref 136–145)
Total Bilirubin: 0.2 MG/DL (ref 0.2–1.0)
Total Protein: 7.1 g/dL (ref 6.4–8.2)

## 2022-09-18 LAB — TSH: TSH, 3rd Generation: 0.55 u[IU]/mL (ref 0.36–3.74)

## 2022-09-18 LAB — MAGNESIUM: Magnesium: 1.5 mg/dL — ABNORMAL LOW (ref 1.6–2.4)

## 2022-09-18 MED ORDER — ACETAMINOPHEN 325 MG PO TABS
325 | ORAL | Status: AC
Start: 2022-09-18 — End: 2022-09-18
  Administered 2022-09-18: 16:00:00 650 mg via ORAL

## 2022-09-18 MED ORDER — BENZONATATE 100 MG PO CAPS
100 | ORAL | Status: AC
Start: 2022-09-18 — End: 2022-09-18
  Administered 2022-09-18: 16:00:00 200 mg via ORAL

## 2022-09-18 MED ORDER — IBUPROFEN 400 MG PO TABS
400 | ORAL | Status: DC
Start: 2022-09-18 — End: 2022-09-18

## 2022-09-18 MED ORDER — MAGNESIUM OXIDE 400 MG PO TABS
400 | ORAL_TABLET | Freq: Every day | ORAL | 1 refills | Status: AC
Start: 2022-09-18 — End: ?

## 2022-09-18 MED ORDER — LIDOCAINE 4 % EX PTCH
4 | CUTANEOUS | Status: DC
Start: 2022-09-18 — End: 2022-09-18
  Administered 2022-09-18: 16:00:00 1 via TRANSDERMAL

## 2022-09-18 MED ORDER — BENZONATATE 200 MG PO CAPS
200 | ORAL_CAPSULE | Freq: Three times a day (TID) | ORAL | 0 refills | Status: AC | PRN
Start: 2022-09-18 — End: 2022-09-28

## 2022-09-18 MED FILL — BENZONATATE 100 MG PO CAPS: 100 MG | ORAL | Qty: 2

## 2022-09-18 MED FILL — SALONPAS PAIN RELIEVING 4 % EX PTCH: 4 % | CUTANEOUS | Qty: 1

## 2022-09-18 MED FILL — ACETAMINOPHEN 325 MG PO TABS: 325 MG | ORAL | Qty: 2

## 2022-09-18 MED FILL — IBU 400 MG PO TABS: 400 MG | ORAL | Qty: 2

## 2022-09-18 NOTE — Discharge Instructions (Addendum)
You were seen in the emergency department for cough, back pain, tongue numbness.  Your workup showed low magnesium levels, you have been prescribed magnesium supplements.  You have prescribed medicine to help with your cough.  Take Tylenol and use lidocaine patches as needed for back pain.  Return to the emergency department if you develop new or worsening symptoms.    Thank You!    It was a pleasure taking care of you in our Emergency Department today. We know that when you come to our Emergency Department, you are entrusting [place] with your health, comfort, and safety. Our physicians and nurses honor that trust, and truly appreciate the opportunity to care for you and your loved ones.      We also value your feedback. If you receive a survey about your Emergency Department experience today, please fill it out.  We care about our patients' feedback, and we listen to what you have to say.  Thank you.    Cameron Proud, MD  ________________________________________________________________________  I have included a copy of your lab results and/or radiologic studies from today's visit so you can have them easily available at your follow-up visit. We hope you feel better and please do not hesitate to contact the ED if you have any questions at all!    Recent Results (from the past 12 hour(s))   CBC with Auto Differential    Collection Time: 09/18/22 12:13 PM   Result Value Ref Range    WBC 5.9 4.1 - 11.1 K/uL    RBC 3.94 (L) 4.10 - 5.70 M/uL    Hemoglobin 11.9 (L) 12.1 - 17.0 g/dL    Hematocrit 37.4 36.6 - 50.3 %    MCV 94.9 80.0 - 99.0 FL    MCH 30.2 26.0 - 34.0 PG    MCHC 31.8 30.0 - 36.5 g/dL    RDW 14.3 11.5 - 14.5 %    Platelets 267 150 - 400 K/uL    MPV 8.7 (L) 8.9 - 12.9 FL    Nucleated RBCs 0.0 0 PER 100 WBC    nRBC 0.00 0.00 - 0.01 K/uL    Neutrophils % 75 32 - 75 %    Lymphocytes % 18 12 - 49 %    Monocytes % 5 5 - 13 %    Eosinophils % 1 0 - 7 %    Basophils % 0 0 - 1 %    Immature Granulocytes 1 (H) 0.0 - 0.5  %    Neutrophils Absolute 4.4 1.8 - 8.0 K/UL    Lymphocytes Absolute 1.1 0.8 - 3.5 K/UL    Monocytes Absolute 0.3 0.0 - 1.0 K/UL    Eosinophils Absolute 0.1 0.0 - 0.4 K/UL    Basophils Absolute 0.0 0.0 - 0.1 K/UL    Absolute Immature Granulocyte 0.0 0.00 - 0.04 K/UL    Differential Type AUTOMATED     CMP    Collection Time: 09/18/22 12:13 PM   Result Value Ref Range    Sodium 136 136 - 145 mmol/L    Potassium 3.9 3.5 - 5.1 mmol/L    Chloride 104 97 - 108 mmol/L    CO2 25 21 - 32 mmol/L    Anion Gap 7 5 - 15 mmol/L    Glucose 262 (H) 65 - 100 mg/dL    BUN 16 6 - 20 MG/DL    Creatinine 0.88 0.70 - 1.30 MG/DL    Bun/Cre Ratio 18 12 - 20  Est, Glom Filt Rate >90 >60 ml/min/1.23m2    Calcium 8.7 8.5 - 10.1 MG/DL    Total Bilirubin 0.2 0.2 - 1.0 MG/DL    ALT 17 12 - 78 U/L    AST 12 (L) 15 - 37 U/L    Alk Phosphatase 58 45 - 117 U/L    Total Protein 7.1 6.4 - 8.2 g/dL    Albumin 3.5 3.5 - 5.0 g/dL    Globulin 3.6 2.0 - 4.0 g/dL    Albumin/Globulin Ratio 1.0 (L) 1.1 - 2.2     Magnesium    Collection Time: 09/18/22 12:13 PM   Result Value Ref Range    Magnesium 1.5 (L) 1.6 - 2.4 mg/dL   TSH    Collection Time: 09/18/22 12:13 PM   Result Value Ref Range    TSH, 3RD GENERATION 0.55 0.36 - 3.74 uIU/mL     CT HEAD WO CONTRAST   Final Result      No acute process.      XR CHEST PORTABLE   Final Result   Hypoinflated, grossly clear lungs.             The exam and treatment you received in the Emergency Department were for an urgent problem and are not intended as complete care. It is important that you follow up with a doctor, nurse practitioner, or physician assistant for ongoing care. If your symptoms become worse or you do not improve as expected and you are unable to reach your usual health care provider, you should return to the Emergency Department. We are available 24 hours a day.    Please take your discharge instructions with you when you go to your follow-up appointment.     If a prescription has been provided,  please have it filled as soon as possible to prevent a delay in treatment. Read the entire medication instruction sheet provided to you by the pharmacy. If you have any questions or reservations about taking the medication due to side effects or interactions with other medications, please call your primary care physician or contact the ER to speak with the charge nurse.     Please make an appointment with your family doctor or the physician you were referred to for follow-up of this visit as instructed on your discharge paperwork. Return to the ER if you are unable to be seen or if you are unable to be seen in a timely manner.    Should you experience abdominal pain lasting greater than 6 hours, chest pain, difficulty breathing, fever/chills, numbness/tingling, skin changes or other symptoms that concern you, return to the ED sooner. If you feel worse over the next 24 hours, please return to the ED. We are available 24 hours a day. Thank you for trusting [place] with your care!

## 2022-09-18 NOTE — ED Notes (Signed)
Pt discharged by Lycan Davee, RN. Discharge instructions discussed and pt given opportunity to ask questions. Pt ambulatory out of ED

## 2022-09-18 NOTE — ED Provider Notes (Signed)
[facility] EM[facility] DEPT  EMERGENCY DEPARTMENT ENCOUNTER       Pt Name: Brandon Rodriguez  MRN: [id number]  Hobbs Jan 15, 1971  Date of evaluation: 09/18/2022  Provider: Cameron Proud, MD   PCP: No primary care provider on file.  Note Started: 5:25 PM 09/20/22     CHIEF COMPLAINT       Chief Complaint   Patient presents with    Back Pain     Pt arrives ambulatory to triage, reports upper mid back pain x 3 days, also reports the tip of his tongue became numb at the same time as his pain started. Hx of lower back surgery.         HISTORY OF PRESENT ILLNESS: 1 or more elements      History From: Patient  None     Brandon Rodriguez is a 52 y.o. male who presents to the emergency department upper back pain and sensation of numbness on the tip of his tongue.  Patient reports symptoms began 3 days ago.  Patient reports that he has been coughing, reports a dry cough and this exacerbates his upper back pain significantly.  Denies any other numbness, weakness, dizziness, vision changes, speech changes, gait changes.  Patient denies any change in sensation of taste.  Denies any urinary or stool incontinence, denies any saddle anesthesia, denies history of IV drug use,     Nursing Notes were all reviewed and agreed with or any disagreements were addressed in the HPI.     REVIEW OF SYSTEMS      Review of Systems     Positives and Pertinent negatives as per HPI.    PAST HISTORY     Past Medical History:  History reviewed. No pertinent past medical history.      Past Surgical History:  History reviewed. No pertinent surgical history.    Family History:  History reviewed. No pertinent family history.    Social History:       Allergies:  Allergies   Allergen Reactions    Ibuprofen Hives    Lactose Other (See Comments)     GI upset       CURRENT MEDICATIONS      Discharge Medication List as of 09/18/2022  4:04 PM            PHYSICAL EXAM      ED Triage Vitals [09/18/22 1115]   Enc Vitals Group      BP 122/85      Pulse (!) 112      Respirations 16       Temp 98.1 F (36.7 C)      Temp Source Oral      SpO2 98 %      Weight - Scale 77.3 kg (170 lb 6.7 oz)      Height 1.676 m (5\' 6" )      Head Circumference       Peak Flow       Pain Score       Pain Loc       Pain Edu?       Excl. in [place]?               Physical Exam  Constitutional:       Appearance: Normal appearance.   HENT:      Head: Normocephalic and atraumatic.      Right Ear: External ear normal.      Left Ear: External ear normal.      Nose:  Nose normal.      Mouth/Throat:      Mouth: Mucous membranes are moist.   Eyes:      Extraocular Movements: Extraocular movements intact.      Conjunctiva/sclera: Conjunctivae normal.   Cardiovascular:      Rate and Rhythm: Normal rate and regular rhythm.      Heart sounds: No murmur heard.     No friction rub. No gallop.   Pulmonary:      Effort: Pulmonary effort is normal. No respiratory distress.      Breath sounds: No stridor. No wheezing, rhonchi or rales.   Abdominal:      General: Abdomen is flat. There is no distension.      Palpations: Abdomen is soft. There is no mass.      Tenderness: There is no abdominal tenderness. There is no guarding or rebound.   Musculoskeletal:      Cervical back: Neck supple.      Right lower leg: No edema.      Left lower leg: No edema.      Comments: No midline spinal tenderness   Skin:     General: Skin is warm and dry.   Neurological:      General: No focal deficit present.      Mental Status: He is alert.      Comments: MENTAL STATUS: AAOx3, fund of knowledge appropriate  LANG/SPEECH: Naming and repetition intact, fluent, follows commands    CRANIAL NERVES:  II: Pupils equal and reactive, visual fields intact  III, IV, VI: EOM intact, no gaze preference or deviation, no nystagmus.  V: normal sensation in V1, V2, and V3 segments bilaterally  VII: no facial asymmetry  VIII: normal hearing to speech  IX, X: normal palatal elevation, no uvular deviation  XI: 5/5 head turn and 5/5 shoulder shrug bilaterally  XII: midline tongue  protrusion    MOTOR:  5/5 strength in bilateral upper extremities  5/5 strength in bilateral lower extremities    SENSORY:  Sensation intact to light touch in bilateral upper and lower extremities     COORD: Normal finger to nose and heel to shin no truncal ataxia  GAIT: deferred    Psychiatric:         Mood and Affect: Mood normal.         Behavior: Behavior normal.            DIAGNOSTIC RESULTS   LABS:     No results found for this or any previous visit (from the past 24 hour(s)).}           RADIOLOGY:  Non-plain film images such as CT, Ultrasound and MRI are read by the radiologist. Plain radiographic images are visualized and preliminarily interpreted by the ED Provider with the below findings:     Clear lungs, normal cardiac silhouette, no effusion. No acute process.      Interpretation per the Radiologist below, if available at the time of this note:     CT HEAD WO CONTRAST   Final Result      No acute process.      XR CHEST PORTABLE   Final Result   Hypoinflated, grossly clear lungs.                 PROCEDURES   Unless otherwise noted below, none  Procedures       CRITICAL CARE TIME       SCREENINGS   NIH Stroke Score  Heart Score       Curb-65          EMERGENCY DEPARTMENT COURSE and DIFFERENTIAL DIAGNOSIS/[facility]   Vitals:    Vitals:    09/18/22 1115 09/18/22 1400 09/18/22 1430   BP: 122/85 (!) 136/95 (!) 140/98   Pulse: (!) 112 93    Resp: 16     Temp: 98.1 F (36.7 C)     TempSrc: Oral     SpO2: 98% 95% 95%   Weight: 77.3 kg (170 lb 6.7 oz)     Height: 1.676 m (5\' 6" )              Patient was given the following medications:  Medications   acetaminophen (TYLENOL) tablet 650 mg (650 mg Oral Given 09/18/22 1223)   benzonatate (TESSALON) capsule 200 mg (200 mg Oral Given 09/18/22 1223)       CONSULTS: (Who and What was discussed)  None      Chronic Conditions:     Social Determinants affecting Dx or Tx: None    Records Reviewed (source and summary of external notes): Nursing Notes    CC/HPI Summary, DDx, ED  Course, and Reassessment:   52 year old male presenting the emergency department with back pain, tongue numbness.  Patient has had 3 days of symptoms, no other neurologic deficits on exam.  Will obtain CT head to exclude CVA, mass, bleed, or other lesion.  Will check for electrolyte abnormality.  Patient reports cough which is exacerbating his thoracic back pain, will obtain chest x-ray to exclude bacterial pneumonia.  Will treat with p.o. Tylenol, p.o. Tessalon, lidocaine patch.    Symptoms improved, CT head negative, discharged home.         Disposition Considerations (Tests not done, Shared Decision Making, Pt Expectation of Test or Tx.):      FINAL IMPRESSION     1. Acute bilateral thoracic back pain    2. Numbness of tongue    3. Hypomagnesemia    4. Acute cough          DISPOSITION/PLAN   DISPOSITION Decision To Discharge 09/18/2022 03:06:25 PM      Discharge Note: The patient is stable for discharge home. The signs, symptoms, diagnosis, and discharge instructions have been discussed, understanding conveyed, and agreed upon. The patient is to follow up as recommended or return to ER should their symptoms worsen.      PATIENT REFERRED TO:  No follow-up provider specified.       DISCHARGE MEDICATIONS:     Medication List        START taking these medications      benzonatate 200 MG capsule  Commonly known as: TESSALON  Take 1 capsule by mouth 3 times daily as needed for Cough     magnesium oxide 400 MG tablet  Commonly known as: MAG-OX  Take 1 tablet by mouth daily               Where to Get Your Medications        These medications were sent to [facility] [place], [place] [phone]  [place], [place] [address]      Phone: [phone]   benzonatate 200 MG capsule  magnesium oxide 400 MG tablet           DISCONTINUED MEDICATIONS:  Discharge Medication List as of 09/18/2022  4:04 PM  I am the Primary Clinician of  Record.   Cameron Proud, MD (electronically signed)      (Please note that parts of this dictation were completed with voice recognition software. Quite often unanticipated grammatical, syntax, homophones, and other interpretive errors are inadvertently transcribed by the computer software. Please disregards these errors. Please excuse any errors that have escaped final proofreading.)         Cameron Proud, MD  09/22/22 2113

## 2022-09-18 NOTE — Telephone Encounter (Signed)
[facility]    Subjective: Caller states "he has back pain, numbness in tongue and some facial swelling"     Current Symptoms:     Onset: back pain: 3 days ago; tongue numbness 2 days ago, right sided facial swelling this am    Associated Symptoms:     Pain Severity: 8/10; dull; constant    Temperature:      What has been tried:     LMP: NA Pregnant: NA    Recommended disposition: See in Office Today    Care advice provided, patient verbalizes understanding; denies any other questions or concerns; instructed to call back for any new or worsening symptoms.    Patient/caller agrees to follow-up with PCP     Attention Provider:  Thank you for allowing me to participate in the care of your patient.  The patient was connected to triage in response to symptoms provided.   Please do not respond through this encounter as the response is not directed to a shared pool.    Reason for Disposition   Back pain (with neurologic deficit)   Patient wants to be seen   SEVERE back pain (e.g., excruciating, unable to do any normal activities) and not improved after pain medicine and CARE ADVICE    Protocols used: Neurologic Deficit-ADULT-[place], Face Swelling-ADULT-[place], Back Pain-ADULT-[place]

## 2023-03-27 MED FILL — ZOLPIDEM TARTRATE 10MG TABS: 10 10 MG | ORAL | 30 days supply | Qty: 30 | Fill #0 | Status: AC

## 2023-05-25 MED FILL — ZOLPIDEM TARTRATE 10MG TABS: 10 10 MG | ORAL | 30 days supply | Qty: 30 | Fill #1 | Status: AC

## 2023-06-22 MED FILL — VALSARTAN 320MG TABS: 320 320 MG | ORAL | 90 days supply | Qty: 90 | Fill #0 | Status: AC

## 2023-06-25 MED FILL — ZOLPIDEM TARTRATE 10MG TABS: 10 10 MG | ORAL | 30 days supply | Qty: 30 | Fill #0 | Status: AC

## 2023-07-25 MED FILL — ZOLPIDEM TARTRATE 10MG TABS: 10 10 MG | ORAL | 30 days supply | Qty: 30 | Fill #0 | Status: AC

## 2023-07-27 MED FILL — METFORMIN HCL 1000MG TABS: 1000 1000 MG | ORAL | 90 days supply | Qty: 180 | Fill #0 | Status: AC

## 2023-08-04 DIAGNOSIS — U071 COVID-19: Secondary | ICD-10-CM

## 2023-08-04 NOTE — ED Provider Notes (Signed)
[facility]  EMERGENCY DEPARTMENT ENCOUNTER       Pt Name: Brandon Rodriguez  MRN: [id number]  Birthdate Oct 27, 1970  Date of evaluation: 08/04/2023  Provider: Rebeca Allegra, MD   PCP: No primary care provider on file.  Note Started: 12:31 AM EST 08/05/23     CHIEF COMPLAINT       Chief Complaint   Patient presents with    Cough     Through triage with c/o flu-like symptoms for x1-2 days with fever, non-productive cough, body aches, nausea without emesis. Pt took Tylenol earlier tonight.      HISTORY OF PRESENT ILLNESS: 1 or more elements      History From: patient, History limited by: none     Brandon Rodriguez is a 53 y.o. male w hx of diabetes who presents with 24 hours of nonproductive cough and body aches.  He has had fevers and took Tylenol about 3 hours prior to arrival.  Has recently been around multiple people with taking plane trips to the [place].  He has had some nasuea without vomiting.  He denies chest pain or shortness of breath.    Please See MDM for Additional Details of the HPI/PMH  Nursing Notes were all reviewed and agreed with or any disagreements were addressed in the HPI.     REVIEW OF SYSTEMS      Positives and Pertinent negatives as per HPI.    PAST HISTORY     Past Medical History:  No past medical history on file.    Past Surgical History:  No past surgical history on file.    Family History:  No family history on file.    Social History:       Allergies:  Allergies   Allergen Reactions    Ibuprofen Hives    Lactose Other (See Comments)     GI upset       CURRENT MEDICATIONS      Discharge Medication List as of 08/05/2023  2:03 AM        CONTINUE these medications which have NOT CHANGED    Details   magnesium oxide (MAG-OX) 400 MG tablet Take 1 tablet by mouth daily, Disp-30 tablet, R-1Normal             SCREENINGS               No data recorded            PHYSICAL EXAM      ED Triage Vitals [08/04/23 2354]   Encounter Vitals Group      BP 120/87      Systolic BP Percentile        Diastolic BP Percentile       Pulse (!) 121      Respirations 18      Temp 99.5 F (37.5 C)      Temp src       SpO2 99 %      Weight - Scale 81 kg (178 lb 9.2 oz)      Height 1.689 m (5' 6.5")      Head Circumference       Peak Flow       Pain Score       Pain Loc       Pain Education       Exclude from Growth Chart       Physical Exam  Vital signs reviewed and documented in EMR  Nontoxic and well-appearing  No acute distress  Tachycardia to 120, regular  CTAB, no acc muscle use or wheezing  Abdomen nondistended  No focal neuro deficits  No peripheral edema  No rash     DIAGNOSTIC RESULTS   LABS:     Recent Results (from the past 24 hour(s))   COVID-19 & Influenza Combo    Collection Time: 08/05/23 12:12 AM    Specimen: Nasopharyngeal   Result Value Ref Range    Source Nasopharyngeal      SARS-CoV-2, PCR Detected (A) NOTD      Rapid Influenza A By PCR Detected (A) NOTD      Rapid Influenza B By PCR Not detected NOTD     EKG 12 Lead    Collection Time: 08/05/23 12:37 AM   Result Value Ref Range    Ventricular Rate 113 BPM    Atrial Rate 113 BPM    P-R Interval 126 ms    QRS Duration 72 ms    Q-T Interval 318 ms    QTc Calculation (Bazett) 436 ms    P Axis 35 degrees    R Axis -12 degrees    T Axis 10 degrees    Diagnosis       Sinus tachycardia  Otherwise normal ECG  No previous ECGs available     CBC with Auto Differential    Collection Time: 08/05/23 12:39 AM   Result Value Ref Range    WBC 7.1 4.1 - 11.1 K/uL    RBC 3.93 (L) 4.10 - 5.70 M/uL    Hemoglobin 11.4 (L) 12.1 - 17.0 g/dL    Hematocrit 16.1 (L) 36.6 - 50.3 %    MCV 88.8 80.0 - 99.0 FL    MCH 29.0 26.0 - 34.0 PG    MCHC 32.7 30.0 - 36.5 g/dL    RDW 09.6 04.5 - 40.9 %    Platelets 189 150 - 400 K/uL    MPV 9.2 8.9 - 12.9 FL    Nucleated RBCs 0.0 0 PER 100 WBC    nRBC 0.00 0.00 - 0.01 K/uL    Neutrophils % 74.6 32.0 - 75.0 %    Lymphocytes % 10.2 (L) 12.0 - 49.0 %    Monocytes % 13.8 (H) 5.0 - 13.0 %    Eosinophils % 1.0 0.0 - 7.0 %    Basophils % 0.3 0.0  - 1.0 %    Immature Granulocytes % 0.1 0.0 - 0.5 %    Neutrophils Absolute 5.30 1.80 - 8.00 K/UL    Lymphocytes Absolute 0.72 (L) 0.80 - 3.50 K/UL    Monocytes Absolute 0.98 0.00 - 1.00 K/UL    Eosinophils Absolute 0.07 0.00 - 0.40 K/UL    Basophils Absolute 0.02 0.00 - 0.10 K/UL    Immature Granulocytes Absolute 0.01 0.00 - 0.04 K/UL    Differential Type SMEAR SCANNED      RBC Comment NORMOCYTIC, NORMOCHROMIC     Comprehensive Metabolic Panel    Collection Time: 08/05/23 12:39 AM   Result Value Ref Range    Sodium 135 (L) 136 - 145 mmol/L    Potassium 4.4 3.5 - 5.1 mmol/L    Chloride 105 97 - 108 mmol/L    CO2 22 21 - 32 mmol/L    Anion Gap 8 2 - 12 mmol/L    Glucose 87 65 - 100 mg/dL    BUN 21 (H) 6 - 20 MG/DL    Creatinine 8.11 9.14 - 1.30 MG/DL    BUN/Creatinine Ratio  21 (H) 12 - 20      Est, Glom Filt Rate 88 >60 ml/min/1.23m2    Calcium 8.7 8.5 - 10.1 MG/DL    Total Bilirubin 0.3 0.2 - 1.0 MG/DL    ALT 22 12 - 78 U/L    AST 22 15 - 37 U/L    Alk Phosphatase 64 45 - 117 U/L    Total Protein 7.2 6.4 - 8.2 g/dL    Albumin 3.8 3.5 - 5.0 g/dL    Globulin 3.4 2.0 - 4.0 g/dL    Albumin/Globulin Ratio 1.1 1.1 - 2.2     Troponin    Collection Time: 08/05/23 12:39 AM   Result Value Ref Range    Troponin, High Sensitivity 8 0 - 76 ng/L   Brain Natriuretic Peptide    Collection Time: 08/05/23 12:39 AM   Result Value Ref Range    NT Pro-BNP 56 <125 PG/ML   D-Dimer, Quantitative    Collection Time: 08/05/23 12:39 AM   Result Value Ref Range    D-Dimer, Quant 0.19 0.00 - 0.65 mg/L FEU       EKG: If performed, independent interpretation documented below in the MDM section     RADIOLOGY:  Non-plain film images such as CT, Ultrasound and MRI are read by the radiologist. Plain radiographic images are visualized and preliminarily interpreted by the ED Provider with the findings documented in the MDM section.     Interpretation per the Radiologist below, if available at the time of this note:     XR CHEST (2 VW)   Final Result       No acute abnormality.         Electronically signed by Carola Rhine           PROCEDURES   Unless otherwise noted below, none  Procedures     CRITICAL CARE TIME   None    EMERGENCY DEPARTMENT COURSE and DIFFERENTIAL DIAGNOSIS/MDM   Vitals:    Vitals:    08/04/23 2354 08/05/23 0200   BP: 120/87    Pulse: (!) 121 (!) 106   Resp: 18    Temp: 99.5 F (37.5 C)    SpO2: 99%    Weight: 81 kg (178 lb 9.2 oz)    Height: 1.689 m (5' 6.5")         Patient was given the following medications:  Medications   sodium chloride 0.9 % bolus 500 mL (0 mLs IntraVENous Stopped 08/05/23 0230)       Medical Decision Making  Patient presents to the ED with infectious-sounding symptoms; suspect pneumonia or viral syndrome.  Patient is tahcycardic and has recently had plane travel - discussed low suspicion for PE but we decided to pursue further testing.  No chest pain to suggest ACS.  - CBC, CMP  - troponin, BNP, d-dimer, EKG  - IVF  - COVID/flu  - CXR, CTA if needed    Amount and/or Complexity of Data Reviewed  Labs: ordered. Decision-making details documented in ED Course.  Radiology: ordered and independent interpretation performed. Decision-making details documented in ED Course.  ECG/medicine tests: ordered and independent interpretation performed. Decision-making details documented in ED Course.    Risk  OTC drugs.  Prescription drug management.      ED Course as of 08/05/23 0354   Wynelle Link Aug 05, 2023   0114 EKG as interpreted by me with sinus rhythm, heart rate 113, normal axis, normal intervals, no ST changes [WB]   0157 Chest  x-ray as interpreted by me with no acute airspace disease, no cardiomegaly, no widened mediastinum    Patient tested positive for both COVID and the flu.  Had a shared decision making discussion and he declined both Tamiflu and Paxlovid.    Serum lab workup unremarkable with negative troponin, no electrolyte abnormality, no organ dysfunction, negative troponin.  BNP negative - doubt PE.    Tachycardia improved  to HR 106.  Recommended giving additional IVF but patient declined and wanted to go home.  I think this is reasonable given nontoxic appearance and reassuring workup.  Discussed customary return precautions. [WB]      ED Course User Index  [WB] Nester Bachus, Charlyn Minerva, MD     FINAL IMPRESSION     1. COVID-19    2. Influenza A          DISPOSITION/PLAN   Brandon Rodriguez  results have been reviewed with him.  He has been counseled regarding his diagnosis, treatment, and plan.  He verbally conveys understanding and agreement of the signs, symptoms, diagnosis, treatment and prognosis and additionally agrees to follow up as discussed.  He also agrees with the care-plan and conveys that all of his questions have been answered.  I have also provided discharge instructions for him that include: educational information regarding their diagnosis and treatment, and list of reasons why they would want to return to the ED prior to their follow-up appointment, should his condition change.     CLINICAL IMPRESSION    Discharge Note: The patient is stable for discharge home. The signs, symptoms, diagnosis, and discharge instructions have been discussed, understanding conveyed, and agreed upon. The patient is to follow up as recommended or return to ER should their symptoms worsen.      PATIENT REFERRED TO:  [facility]  [address]  [place] [address]  [phone]    As needed, If symptoms worsen       DISCHARGE MEDICATIONS:     Medication List        START taking these medications      acetaminophen 325 MG tablet  Commonly known as: Tylenol  Take 2 tablets by mouth every 6 hours as needed for Pain            ASK your doctor about these medications      magnesium oxide 400 MG tablet  Commonly known as: MAG-OX  Take 1 tablet by mouth daily               Where to Get Your Medications        These medications were sent to [facility] [address] - [place], [place] - [address] - P  [phone] Carmon Ginsberg [phone]  [address], [place] [address]      Phone: [phone]   acetaminophen 325 MG tablet           DISCONTINUED MEDICATIONS:  Discharge Medication List as of 08/05/2023  2:03 AM          I am the Primary Clinician of Record.   Rebeca Allegra, MD (electronically signed)    (Please note that parts of this dictation were completed with voice recognition software. Quite often unanticipated grammatical, syntax, homophones, and other interpretive errors are inadvertently transcribed by the computer software. Please disregards these errors. Please excuse any errors that have escaped final proofreading.)         Eshaal Duby, Charlyn Minerva, MD  08/05/23 [phone]

## 2023-08-05 ENCOUNTER — Emergency Department: Admit: 2023-08-05 | Payer: PRIVATE HEALTH INSURANCE

## 2023-08-05 ENCOUNTER — Inpatient Hospital Stay
Admit: 2023-08-05 | Discharge: 2023-08-05 | Disposition: A | Discharge status: Home / Self Care | Payer: PRIVATE HEALTH INSURANCE | Attending: Student in an Organized Health Care Education/Training Program

## 2023-08-05 DIAGNOSIS — U071 COVID-19: Secondary | ICD-10-CM

## 2023-08-05 LAB — EKG 12-LEAD
Atrial Rate: 113 {beats}/min
P Axis: 35 degrees
P-R Interval: 126 ms
Q-T Interval: 318 ms
QRS Duration: 72 ms
QTc Calculation (Bazett): 436 ms
R Axis: -12 degrees
T Axis: 10 degrees
Ventricular Rate: 113 {beats}/min

## 2023-08-05 LAB — CBC WITH AUTO DIFFERENTIAL
Basophils %: 0.3 % (ref 0.0–1.0)
Basophils Absolute: 0.02 10*3/uL (ref 0.00–0.10)
Eosinophils %: 1 % (ref 0.0–7.0)
Eosinophils Absolute: 0.07 10*3/uL (ref 0.00–0.40)
Hematocrit: 34.9 % — ABNORMAL LOW (ref 36.6–50.3)
Hemoglobin: 11.4 g/dL — ABNORMAL LOW (ref 12.1–17.0)
Immature Granulocytes %: 0.1 % (ref 0.0–0.5)
Immature Granulocytes Absolute: 0.01 10*3/uL (ref 0.00–0.04)
Lymphocytes %: 10.2 % — ABNORMAL LOW (ref 12.0–49.0)
Lymphocytes Absolute: 0.72 10*3/uL — ABNORMAL LOW (ref 0.80–3.50)
MCH: 29 pg (ref 26.0–34.0)
MCHC: 32.7 g/dL (ref 30.0–36.5)
MCV: 88.8 fL (ref 80.0–99.0)
MPV: 9.2 fL (ref 8.9–12.9)
Monocytes %: 13.8 % — ABNORMAL HIGH (ref 5.0–13.0)
Monocytes Absolute: 0.98 10*3/uL (ref 0.00–1.00)
Neutrophils %: 74.6 % (ref 32.0–75.0)
Neutrophils Absolute: 5.3 10*3/uL (ref 1.80–8.00)
Nucleated RBCs: 0 /100{WBCs}
Platelets: 189 10*3/uL (ref 150–400)
RBC: 3.93 M/uL — ABNORMAL LOW (ref 4.10–5.70)
RDW: 12.9 % (ref 11.5–14.5)
WBC: 7.1 10*3/uL (ref 4.1–11.1)
nRBC: 0 10*3/uL (ref 0.00–0.01)

## 2023-08-05 LAB — COMPREHENSIVE METABOLIC PANEL
ALT: 22 U/L (ref 12–78)
AST: 22 U/L (ref 15–37)
Albumin/Globulin Ratio: 1.1 (ref 1.1–2.2)
Albumin: 3.8 g/dL (ref 3.5–5.0)
Alk Phosphatase: 64 U/L (ref 45–117)
Anion Gap: 8 mmol/L (ref 2–12)
BUN/Creatinine Ratio: 21 — ABNORMAL HIGH (ref 12–20)
BUN: 21 mg/dL — ABNORMAL HIGH (ref 6–20)
CO2: 22 mmol/L (ref 21–32)
Calcium: 8.7 mg/dL (ref 8.5–10.1)
Chloride: 105 mmol/L (ref 97–108)
Creatinine: 1.02 mg/dL (ref 0.70–1.30)
Est, Glom Filt Rate: 88 mL/min/{1.73_m2} (ref >60)
Globulin: 3.4 g/dL (ref 2.0–4.0)
Glucose: 87 mg/dL (ref 65–100)
Potassium: 4.4 mmol/L (ref 3.5–5.1)
Sodium: 135 mmol/L — ABNORMAL LOW (ref 136–145)
Total Bilirubin: 0.3 mg/dL (ref 0.2–1.0)
Total Protein: 7.2 g/dL (ref 6.4–8.2)

## 2023-08-05 LAB — BRAIN NATRIURETIC PEPTIDE: NT Pro-BNP: 56 pg/mL (ref <125)

## 2023-08-05 LAB — COVID-19 & INFLUENZA COMBO
Rapid Influenza A By PCR: DETECTED — AB
Rapid Influenza B By PCR: NOT DETECTED
SARS-CoV-2, PCR: DETECTED — AB

## 2023-08-05 LAB — TROPONIN: Troponin, High Sensitivity: 8 ng/L (ref 0–76)

## 2023-08-05 LAB — D-DIMER, QUANTITATIVE: D-Dimer, Quant: 0.19 mg{FEU}/L (ref 0.00–0.65)

## 2023-08-05 MED ORDER — ACETAMINOPHEN 325 MG PO TABS
325 | ORAL_TABLET | Freq: Four times a day (QID) | ORAL | 0 refills | Status: AC | PRN
Start: 2023-08-05 — End: 2023-08-12

## 2023-08-05 MED ORDER — SODIUM CHLORIDE 0.9 % IV BOLUS
0.9 | Freq: Once | INTRAVENOUS | Status: AC
Start: 2023-08-05 — End: 2023-08-05
  Administered 2023-08-05: 06:00:00 500 mL via INTRAVENOUS

## 2023-08-05 NOTE — Discharge Instructions (Signed)
Please make a followup with your primary care physician.  If you have any new or worsening concerning medical symptoms please return to the emergency department.    Local Primary Care Physicians  [facility] [phone]  M. Andres Shad, MD  Marge Duncans, MD  Otis Brace, MD  Darlyn Chamber, FNP  Fransisca Kaufmann, FNP  Renold Genta, FNP [facility] [phone]  Tressie Ellis, MD  Lorne Skeens, NP  Mosie Lukes, FNP  Carley Hammed, FNP   [facility] [phone]  Orson Slick, MD  Erline Hau, MD  Allayne Butcher, MD  Loman Brooklyn, FNP Sondra Barges [facility] [phone]  Micheline Chapman, MD  Eula Fried, MD  Marcy Siren, MD  Graylin Shiver, MD  Joseph Art, MD  Rica Koyanagi, CNP   [facility] [phone]  Zena Amos, MD  Lenox Ahr, NP  Morene Antu, NP Sallyanne [facility] [phone] "Griselda Miner, MD  Posey Rea, MD  Jenkins Rouge, MD  Linzie Collin, CNP  Winfred Leeds, CNP  Amy Bruna Potter, PA-C   [facility] [phone]  Pat Patrick, MD  Brayton El, MD  Michel Bickers, CNP  Kathi Ludwig, FNP  [facility] [address] [phone]  Kern Reap, MD  Jimmy Picket, MD  [facility], DO  Norva Karvonen, MD  Voncille Lo, MD  Joellyn Quails PA-C   [facility] [phone]  Hunt Oris, MD  [facility], MD  Mingo Amber, MD  Mare Ferrari, MD  Arlee Muslim, MD  Wyvonna Plum, FNP  Candis Musa, FNP  Gaylord Shih, FNP  Tommy Medal, FNP [facility] [phone]  Raquel Roselind Messier, MD  Bryson Dames, MD  Renato Battles, MD  Lennox Grumbles, MD  Shona Needles, MD  Jackolyn Confer, MD  Wendi Maya, MD  Roney Mans, MD   [facility] [phone]  Franne Forts, MD  Amado Nash, CNP  Jamey Reas, CNP  Henrietta Hoover, [facility] [phone]  Lynnae Sandhoff, MD  Driscilla Moats, MD  Jackquline Denmark, PA-C  Marcelene Butte, PA-C   [facility] [phone]  [facility]  Lexine Baton, MD  Jeannette How, MD  Elta Guadeloupe, PA-C  Loney Laurence, PA-C  Toney Reil, FNP  Angelina Sheriff, FNP  Mikle Bosworth, FNP   [facility]   [phone]  Christin Fudge, MD  Toniann Ket, MD  Marlowe Aschoff, [facility]

## 2023-08-05 NOTE — ED Triage Notes (Signed)
Through triage with c/o flu-like symptoms for x1-2 days with fever, non-productive cough, body aches, nausea without emesis. Pt took Tylenol earlier tonight. Pt recently on a business trip to [place], in/out of airports.

## 2023-08-23 MED FILL — ZOLPIDEM TARTRATE 10MG TABS: 10 10 MG | ORAL | 30 days supply | Qty: 30 | Fill #1 | Status: AC

## 2023-08-30 MED FILL — VALSARTAN 320MG TABS: 320 320 MG | ORAL | 90 days supply | Qty: 90 | Fill #0 | Status: AC
